# Patient Record
Sex: Female | Born: 1990 | Race: Black or African American | Hispanic: No
Health system: Southern US, Community
[De-identification: ages and names within clinical notes are randomized; demographics above are authoritative.]

## PROBLEM LIST (undated history)

## (undated) DIAGNOSIS — G43909 Migraine, unspecified, not intractable, without status migrainosus: Secondary | ICD-10-CM

## (undated) DIAGNOSIS — I10 Essential (primary) hypertension: Secondary | ICD-10-CM

## (undated) DIAGNOSIS — F419 Anxiety disorder, unspecified: Secondary | ICD-10-CM

---

## 2011-11-14 ENCOUNTER — Emergency Department (HOSPITAL_COMMUNITY): Payer: Managed Care, Other (non HMO)

## 2011-11-14 ENCOUNTER — Encounter (HOSPITAL_COMMUNITY): Payer: Self-pay | Admitting: *Deleted

## 2011-11-14 ENCOUNTER — Emergency Department (HOSPITAL_COMMUNITY)
Admission: EM | Admit: 2011-11-14 | Discharge: 2011-11-15 | Disposition: A | Discharge status: Home / Self Care | Payer: Managed Care, Other (non HMO) | Attending: Emergency Medicine | Admitting: Emergency Medicine

## 2011-11-14 DIAGNOSIS — R000 Tachycardia, unspecified: Secondary | ICD-10-CM | POA: Insufficient documentation

## 2011-11-14 DIAGNOSIS — J111 Influenza due to unidentified influenza virus with other respiratory manifestations: Secondary | ICD-10-CM

## 2011-11-14 LAB — COMPREHENSIVE METABOLIC PANEL
ALT: 10 U/L (ref 0–35)
AST: 14 U/L (ref 0–37)
Albumin: 4 g/dL (ref 3.5–5.2)
CO2: 24 mEq/L (ref 19–32)
Calcium: 9.5 mg/dL (ref 8.4–10.5)
Chloride: 101 mEq/L (ref 96–112)
Creatinine, Ser: 0.96 mg/dL (ref 0.50–1.10)
GFR calc non Af Amer: 85 mL/min — ABNORMAL LOW (ref >90)
Sodium: 135 mEq/L (ref 135–145)
Total Bilirubin: 0.5 mg/dL (ref 0.3–1.2)

## 2011-11-14 LAB — URINALYSIS, ROUTINE W REFLEX MICROSCOPIC
Leukocytes, UA: NEGATIVE
Nitrite: NEGATIVE
Protein, ur: NEGATIVE mg/dL
Specific Gravity, Urine: 1.025 (ref 1.005–1.030)
Urobilinogen, UA: 1 mg/dL (ref 0.0–1.0)

## 2011-11-14 LAB — CBC WITH DIFFERENTIAL/PLATELET
Basophils Absolute: 0 10*3/uL (ref 0.0–0.1)
Basophils Relative: 0 % (ref 0–1)
Lymphocytes Relative: 12 % (ref 12–46)
MCHC: 33.9 g/dL (ref 30.0–36.0)
Neutro Abs: 7.9 10*3/uL — ABNORMAL HIGH (ref 1.7–7.7)
Platelets: 302 10*3/uL (ref 150–400)
RDW: 13.2 % (ref 11.5–15.5)
WBC: 9.9 10*3/uL (ref 4.0–10.5)

## 2011-11-14 LAB — URINE MICROSCOPIC-ADD ON

## 2011-11-14 LAB — PREGNANCY, URINE: Preg Test, Ur: NEGATIVE

## 2011-11-14 MED ORDER — IBUPROFEN 400 MG PO TABS
400.0000 mg | ORAL_TABLET | Freq: Once | ORAL | Status: AC
  Administered 2011-11-14: 400 mg via ORAL

## 2011-11-14 MED ORDER — ACETAMINOPHEN 325 MG PO TABS
650.0000 mg | ORAL_TABLET | Freq: Once | ORAL | Status: AC
  Administered 2011-11-14: 650 mg via ORAL
  Filled 2011-11-14: qty 2

## 2011-11-14 NOTE — ED Provider Notes (Addendum)
History     CSN: [id number]  Arrival date & time 11/14/11  2027   First MD Initiated Contact with Patient 11/14/11 2322      Chief Complaint  Patient presents with  . multiple complaints     (Consider location/radiation/quality/duration/timing/severity/associated sxs/prior treatment) Patient is a 21 y.o. female presenting with URI. The history is provided by the patient.  URI The primary symptoms include fever, fatigue, sore throat, cough and myalgias. The current episode started yesterday. This is a new problem. The problem has been gradually worsening.  The fever began today. The maximum temperature recorded prior to her arrival was 103 to 104 F. The temperature was taken by an oral thermometer.  The sore throat began today. The sore throat is not accompanied by trouble swallowing, drooling, hoarse voice or stridor.  The onset of the illness is associated with exposure to sick contacts. Symptoms associated with the illness include chills, congestion and rhinorrhea.    History reviewed. No pertinent past medical history.  History reviewed. No pertinent past surgical history.  No family history on file.  History  Substance Use Topics  . Smoking status: Never Smoker   . Smokeless tobacco: Not on file  . Alcohol Use: Yes    OB History    Grav Para Term Preterm Abortions TAB SAB Ect Mult Living                  Review of Systems  Constitutional: Positive for fever, chills and fatigue.  HENT: Positive for congestion, sore throat and rhinorrhea. Negative for hoarse voice, drooling and trouble swallowing.   Respiratory: Positive for cough. Negative for stridor.   Musculoskeletal: Positive for myalgias.  All other systems reviewed and are negative.    Allergies  Review of patient's allergies indicates no known allergies.  Home Medications   Current Outpatient Rx  Name Route Sig Dispense Refill  . IBUPROFEN 200 MG PO TABS Oral Take 400 mg by mouth every 6 (six)  hours as needed. For pain    . NORGESTIMATE-ETH ESTRADIOL 0.25-35 MG-MCG PO TABS Oral Take 1 tablet by mouth daily.    Marland Kitchen PSEUDOEPH-DOXYLAMINE-DM-APAP 60-12.06-27-998 MG/30ML PO LIQD Oral Take 30 mLs by mouth daily as needed. For congestion      BP 128/81  Pulse 103  Temp 102.5 F (39.2 C) (Oral)  Resp 18  SpO2 100%  LMP 11/11/2011  Physical Exam  Nursing note and vitals reviewed. Constitutional: She is oriented to person, place, and time. She appears well-developed and well-nourished. No distress.  HENT:  Head: Normocephalic and atraumatic.  Right Ear: Tympanic membrane and ear canal normal.  Left Ear: Tympanic membrane and ear canal normal.  Nose: Mucosal edema and rhinorrhea present.  Mouth/Throat: Posterior oropharyngeal erythema present. No oropharyngeal exudate or posterior oropharyngeal edema.  Eyes: Conjunctivae normal and EOM are normal. Pupils are equal, round, and reactive to light.  Neck: Normal range of motion. Neck supple.  Cardiovascular: Regular rhythm and intact distal pulses.  Tachycardia present.   No murmur heard. Pulmonary/Chest: Effort normal and breath sounds normal. No respiratory distress. She has no wheezes. She has no rales.  Abdominal: Soft. She exhibits no distension. There is no tenderness. There is no rebound and no guarding.  Musculoskeletal: Normal range of motion. She exhibits no edema and no tenderness.  Neurological: She is alert and oriented to person, place, and time.  Skin: Skin is warm and dry. No rash noted. No erythema.  Psychiatric: She has a normal mood and  affect. Her behavior is normal.    ED Course  Procedures (including critical care time)  Labs Reviewed  URINALYSIS, ROUTINE W REFLEX MICROSCOPIC - Abnormal; Notable for the following:    Hgb urine dipstick MODERATE (*)     All other components within normal limits  CBC WITH DIFFERENTIAL - Abnormal; Notable for the following:    Neutrophils Relative 80 (*)     Neutro Abs 7.9 (*)      All other components within normal limits  COMPREHENSIVE METABOLIC PANEL - Abnormal; Notable for the following:    Glucose, Bld 100 (*)     GFR calc non Af Amer 85 (*)     All other components within normal limits  URINE MICROSCOPIC-ADD ON - Abnormal; Notable for the following:    Squamous Epithelial / LPF FEW (*)     All other components within normal limits  PREGNANCY, URINE  RAPID STREP SCREEN   Dg Chest 2 View  11/14/2011  *RADIOLOGY REPORT*  Clinical Data: Cough and fever.  CHEST - 2 VIEW  Comparison: None.  Findings: The heart size and pulmonary vascularity are normal. The lungs appear clear and expanded without focal air space disease or consolidation. No blunting of the costophrenic angles.  No pneumothorax.  Mediastinal contours appear intact.  Small cervical ribs.  IMPRESSION: No evidence of active pulmonary disease.   Original Report Authenticated By: Marlon Pel, M.D.      1. Influenza       MDM   Pt with symptoms consistent with influenza.  Normal exam here but is febrile.  No signs of breathing difficulty  No signs of strep pharyngitis, otitis or abnormal abdominal findings.   CXR wnl and labs wnl.  Will continue antipyretica and rest and fluids and return for any further problems.         Gwyneth Sprout, MD 11/14/11 1610  Gwyneth Sprout, MD 11/15/11 9604

## 2011-11-14 NOTE — ED Notes (Signed)
The pt has a cold  With aching all over her body sorethroat chills  Coughing non-productive since yesterday.  She thinks she has the flu

## 2011-11-14 NOTE — ED Notes (Signed)
Last tylenol this am

## 2011-11-14 NOTE — ED Notes (Signed)
Re-iterates sx of: cough, sneezing, fever, sore throat. Onset last night. Pt alert, NAD, calm, interactive, skin W&D, resps e/u, speaking in clear complete sentences. Lab results reviewed. cxr & rapid strep added per protocol. To xray now.

## 2011-11-15 MED ORDER — OSELTAMIVIR PHOSPHATE 75 MG PO CAPS: 75.0000 mg | ORAL_CAPSULE | Freq: Two times a day (BID) | ORAL | Status: DC

## 2012-12-11 ENCOUNTER — Emergency Department (HOSPITAL_COMMUNITY): Payer: Managed Care, Other (non HMO)

## 2012-12-11 ENCOUNTER — Encounter (HOSPITAL_COMMUNITY): Payer: Self-pay | Admitting: Emergency Medicine

## 2012-12-11 ENCOUNTER — Emergency Department (HOSPITAL_COMMUNITY)
Admission: EM | Admit: 2012-12-11 | Discharge: 2012-12-12 | Disposition: A | Discharge status: Home / Self Care | Payer: Managed Care, Other (non HMO) | Attending: Emergency Medicine | Admitting: Emergency Medicine

## 2012-12-11 DIAGNOSIS — R091 Pleurisy: Secondary | ICD-10-CM | POA: Insufficient documentation

## 2012-12-11 DIAGNOSIS — R079 Chest pain, unspecified: Secondary | ICD-10-CM

## 2012-12-11 DIAGNOSIS — R0602 Shortness of breath: Secondary | ICD-10-CM | POA: Insufficient documentation

## 2012-12-11 LAB — POCT I-STAT TROPONIN I: Troponin i, poc: 0 ng/mL (ref 0.00–0.08)

## 2012-12-11 LAB — POCT I-STAT, CHEM 8
BUN: 9 mg/dL (ref 6–23)
Chloride: 104 mEq/L (ref 96–112)
Creatinine, Ser: 1.2 mg/dL — ABNORMAL HIGH (ref 0.50–1.10)
Potassium: 3.6 mEq/L (ref 3.5–5.1)
Sodium: 141 mEq/L (ref 135–145)
TCO2: 25 mmol/L (ref 0–100)

## 2012-12-11 NOTE — ED Notes (Signed)
Pt presents with c/o chest pain that she says has been going on for about a week. Pt says the pain is in the center of her chest. Pt says she feels the pain when she takes a deep breath, denies cough. Pt says she has had some shortness of breath during the day with this chest pain but does not have any shortness of breath at this time.

## 2012-12-12 LAB — CBC WITH DIFFERENTIAL/PLATELET
Hemoglobin: 12.3 g/dL (ref 12.0–15.0)
Lymphocytes Relative: 44 % (ref 12–46)
Lymphs Abs: 3.3 10*3/uL (ref 0.7–4.0)
MCH: 25.7 pg — ABNORMAL LOW (ref 26.0–34.0)
Monocytes Relative: 8 % (ref 3–12)
Neutro Abs: 3.4 10*3/uL (ref 1.7–7.7)
Neutrophils Relative %: 46 % (ref 43–77)
Platelets: 326 10*3/uL (ref 150–400)
RBC: 4.78 MIL/uL (ref 3.87–5.11)
WBC: 7.4 10*3/uL (ref 4.0–10.5)

## 2012-12-12 LAB — D-DIMER, QUANTITATIVE: D-Dimer, Quant: <0.27 ug/mL-FEU (ref 0.00–0.48)

## 2012-12-12 MED ORDER — KETOROLAC TROMETHAMINE 60 MG/2ML IM SOLN
60.0000 mg | Freq: Once | INTRAMUSCULAR | Status: AC
  Administered 2012-12-12: 60 mg via INTRAMUSCULAR
  Filled 2012-12-12: qty 2

## 2012-12-12 MED ORDER — KETOROLAC TROMETHAMINE 30 MG/ML IJ SOLN
30.0000 mg | Freq: Once | INTRAMUSCULAR | Status: DC
  Filled 2012-12-12: qty 1

## 2012-12-12 MED ORDER — NAPROXEN 500 MG PO TABS: 500.0000 mg | ORAL_TABLET | Freq: Two times a day (BID) | ORAL | Status: DC

## 2012-12-12 NOTE — ED Provider Notes (Signed)
Medical screening examination/treatment/procedure(s) were performed by non-physician practitioner and as supervising physician I was immediately available for consultation/collaboration.  Sunnie Nielsen, MD 12/12/12 [phone]

## 2012-12-12 NOTE — ED Provider Notes (Signed)
CSN: [id number]     Arrival date & time 12/11/12  2258 History   First MD Initiated Contact with Patient 12/11/12 2308     Chief Complaint  Patient presents with  . Chest Pain   (Consider location/radiation/quality/duration/timing/severity/associated sxs/prior Treatment) HPI Whitney Andrews is a 22 y.o. female presents emergency department complaining of pleuritic chest pain or shortness of breath. Patient states that her pain started a week ago. States it is worsened with taking deep breaths and coughing. She denies any cough. States having shortness of breath and states pain feels like tightness and sharp at the same time. States pain is in the center of the chest and radiates to left side. States pain has been constant for a week. 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 She denies any injuries. She denies taking any oral contraceptives and denies any recent travel or surgeries. Patient denies any urinary symptoms and denies any fever. She denies any prior similar symptoms. She is otherwise healthy and does not take any medications.  History reviewed. No pertinent past medical history. History reviewed. No pertinent past surgical history. No family history on file. History  Substance Use Topics  . Smoking status: Never Smoker   . Smokeless tobacco: Never Used  . Alcohol Use: Yes     Comment: socially    OB History   Grav Para Term Preterm Abortions TAB SAB Ect Mult Living                 Review of Systems  Constitutional: Negative for fever and chills.  Respiratory: Positive for chest tightness and shortness of breath. Negative for cough.   Cardiovascular: Positive for chest pain. Negative for palpitations and leg swelling.  Gastrointestinal: Negative for nausea, vomiting, abdominal pain and diarrhea.  Genitourinary: Negative for dysuria, flank pain, vaginal bleeding, vaginal discharge, vaginal pain and pelvic pain.  Musculoskeletal: Negative for arthralgias, myalgias, neck  pain and neck stiffness.  Skin: Negative for rash.  Neurological: Negative for dizziness, weakness and headaches.  All other systems reviewed and are negative.    Allergies  Review of patient's allergies indicates no known allergies.  Home Medications  No current outpatient prescriptions on file. BP 139/89  Pulse 76  Temp(Src) 98.2 F (36.8 C) (Oral)  Resp 14  Ht 5\' 7"  (1.702 m)  SpO2 100%  LMP 11/20/2012 Physical Exam  Nursing note and vitals reviewed. Constitutional: She appears well-developed and well-nourished. No distress.  HENT:  Head: Normocephalic.  Eyes: Conjunctivae are normal.  Neck: Neck supple.  Cardiovascular: Normal rate, regular rhythm and normal heart sounds.   Pulmonary/Chest: Effort normal and breath sounds normal. No respiratory distress. She has no wheezes. She has no rales. She exhibits no tenderness.  No chest tenderness  Abdominal: Soft. Bowel sounds are normal. She exhibits no distension. There is no tenderness. There is no rebound.  Musculoskeletal: She exhibits no edema.  No lower extremity edema bilaterally, no calf swelling or tenderness  Neurological: She is alert.  Skin: Skin is warm and dry.  Psychiatric: She has a normal mood and affect. Her behavior is normal.    ED Course  Procedures (including critical care time) Labs Review Labs Reviewed  CBC WITH DIFFERENTIAL - Abnormal; Notable for the following:    MCV 77.8 (*)    MCH 25.7 (*)    All other components within normal limits  POCT I-STAT, CHEM 8 - Abnormal; Notable for the following:    Creatinine, Ser 1.20 (*)  Calcium, Ion 1.28 (*)    All other components within normal limits  D-DIMER, QUANTITATIVE  POCT I-STAT TROPONIN I   Imaging Review Dg Chest 2 View  12/11/2012   CLINICAL DATA:  New onset of mid chest pain and shortness of breath.  EXAM: CHEST  2 VIEW  COMPARISON:  Chest radiograph performed 11/14/2011  FINDINGS: The lungs are well-aerated and clear. There is no  evidence of focal opacification, pleural effusion or pneumothorax.  The heart is normal in size; the mediastinal contour is within normal limits. No acute osseous abnormalities are seen.  IMPRESSION: No acute cardiopulmonary process seen.   Electronically Signed   By: Roanna Raider M.D.   On: 12/11/2012 23:50    EKG Interpretation   None       MDM   1. Pleurisy   2. Chest pain     Patient with pleuritic chest pain for a week. Patient's vital signs are normal. Labwork including d-dimer are negative. Chest x-ray is negative. Troponin EKG negative. She is low risk for coronary artery disease. Given normal vital signs negative d-dimer I do not think she has PE. She is no pneumothorax or infection on her chest x-ray. Suspect most likely musculoskeletal chest wall pain or pleurisy. 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 She was given Toradol emergency department. Will discharge her home with outpatient followup \ Filed Vitals:   12/11/12 2310  BP: 139/89  Pulse: 76  Temp: 98.2 F (36.8 C)  TempSrc: Oral  Resp: 14  Height: 5\' 7"  (1.702 m)  SpO2: 100%      Lottie Mussel, PA-C 12/12/12 0049

## 2014-03-21 ENCOUNTER — Encounter (HOSPITAL_COMMUNITY): Payer: Self-pay | Admitting: *Deleted

## 2014-03-21 ENCOUNTER — Emergency Department (INDEPENDENT_AMBULATORY_CARE_PROVIDER_SITE_OTHER)
Admission: EM | Admit: 2014-03-21 | Discharge: 2014-03-21 | Disposition: A | Discharge status: Home / Self Care | Payer: BLUE CROSS/BLUE SHIELD | Source: Home / Self Care | Attending: Family Medicine | Admitting: Family Medicine

## 2014-03-21 DIAGNOSIS — G43019 Migraine without aura, intractable, without status migrainosus: Secondary | ICD-10-CM

## 2014-03-21 HISTORY — DX: Migraine, unspecified, not intractable, without status migrainosus: G43.909

## 2014-03-21 MED ORDER — DEXAMETHASONE SODIUM PHOSPHATE 10 MG/ML IJ SOLN
10.0000 mg | Freq: Once | INTRAMUSCULAR | Status: AC
  Administered 2014-03-21: 10 mg via INTRAMUSCULAR

## 2014-03-21 MED ORDER — METOCLOPRAMIDE HCL 5 MG/ML IJ SOLN
5.0000 mg | Freq: Once | INTRAMUSCULAR | Status: AC
  Administered 2014-03-21: 5 mg via INTRAMUSCULAR

## 2014-03-21 MED ORDER — DEXAMETHASONE SODIUM PHOSPHATE 10 MG/ML IJ SOLN
INTRAMUSCULAR | Status: AC
  Filled 2014-03-21: qty 1

## 2014-03-21 MED ORDER — KETOROLAC TROMETHAMINE 60 MG/2ML IM SOLN
60.0000 mg | Freq: Once | INTRAMUSCULAR | Status: AC
  Administered 2014-03-21: 60 mg via INTRAMUSCULAR

## 2014-03-21 MED ORDER — METOCLOPRAMIDE HCL 5 MG/ML IJ SOLN
INTRAMUSCULAR | Status: AC
  Filled 2014-03-21: qty 2

## 2014-03-21 MED ORDER — KETOROLAC TROMETHAMINE 60 MG/2ML IM SOLN
INTRAMUSCULAR | Status: AC
  Filled 2014-03-21: qty 2

## 2014-03-21 NOTE — ED Provider Notes (Signed)
Whitney CapesLeann Dudash is a 24 y.o. female who presents to Urgent Care today for headache. Patient has a 4 day history of severe headache. The headache is consistent with prior episodes of migraine but going on a bit longer than usual. She denies any visual aura, photophobia, or photophobia. She's tried Tylenol which helps. No fevers or chills vomiting or diarrhea. She notes that her mother died last week unexpectedly and she is under a great deal of stress.   Past Medical History  Diagnosis Date  . Migraines    History reviewed. No pertinent past surgical history. History  Substance Use Topics  . Smoking status: Never Smoker   . Smokeless tobacco: Never Used  . Alcohol Use: No   ROS as above Medications: No current facility-administered medications for this encounter.   Current Outpatient Prescriptions  Medication Sig Dispense Refill  . Norgestim-Eth Estrad Triphasic (ORTHO TRI-CYCLEN, 28, PO) Take by mouth.    . naproxen (NAPROSYN) 500 MG tablet Take 1 tablet (500 mg total) by mouth 2 (two) times daily. 30 tablet 0   No Known Allergies   Exam:  BP 151/74 mmHg  Pulse 66  Temp(Src) 98.2 F (36.8 C) (Oral)  Resp 16  SpO2 98%  LMP 03/14/2014 (Exact Date) Gen: Well NAD HEENT: EOMI,  MMM PERRL. Lungs: Normal work of breathing. CTABL Heart: RRR no MRG Abd: NABS, Soft. Nondistended, Nontender Exts: Brisk capillary refill, warm and well perfused.  Neuro: Alert and oriented cranial nerves II through XII are intact Coordination strength sensation and reflexes. Normal balance and gait   Patient was given the below medications prior to discharge: . dexamethasone (DECADRON) injection 10 mg  10 mg Intramuscular Once Rodolph BongEvan S Corey, MD      . ketorolac (TORADOL) injection 60 mg  60 mg Intramuscular Once Rodolph BongEvan S Corey, MD      . metoCLOPramide (REGLAN) injection 5 mg  5 mg Intramuscular Once Rodolph BongEvan S Corey, MD         No results found for this or any previous visit (from the past 24 hour(s)). No  results found.  Assessment and Plan: 24 y.o. female with migraine headache. Patient felt better with injections as above. Follow-up with PCP for further evaluation of headache and elevated blood pressure. Return as needed.  Discussed warning signs or symptoms. Please see discharge instructions. Patient expresses understanding.     Rodolph BongEvan S Corey, MD 03/21/14 1024

## 2014-03-21 NOTE — Discharge Instructions (Signed)
Thank you for coming in today.   Migraine Headache A migraine headache is an intense, throbbing pain on one or both sides of your head. A migraine can last for 30 minutes to several hours. CAUSES  The exact cause of a migraine headache is not always known. However, a migraine may be caused when nerves in the brain become irritated and release chemicals that cause inflammation. This causes pain. Certain things may also trigger migraines, such as:  Alcohol.  Smoking.  Stress.  Menstruation.  Aged cheeses.  Foods or drinks that contain nitrates, glutamate, aspartame, or tyramine.  Lack of sleep.  Chocolate.  Caffeine.  Hunger.  Physical exertion.  Fatigue.  Medicines used to treat chest pain (nitroglycerine), birth control pills, estrogen, and some blood pressure medicines. SIGNS AND SYMPTOMS  Pain on one or both sides of your head.  Pulsating or throbbing pain.  Severe pain that prevents daily activities.  Pain that is aggravated by any physical activity.  Nausea, vomiting, or both.  Dizziness.  Pain with exposure to bright lights, loud noises, or activity.  General sensitivity to bright lights, loud noises, or smells. Before you get a migraine, you may get warning signs that a migraine is coming (aura). An aura may include:  Seeing flashing lights.  Seeing bright spots, halos, or zigzag lines.  Having tunnel vision or blurred vision.  Having feelings of numbness or tingling.  Having trouble talking.  Having muscle weakness. DIAGNOSIS  A migraine headache is often diagnosed based on:  Symptoms.  Physical exam.  A CT scan or MRI of your head. These imaging tests cannot diagnose migraines, but they can help rule out other causes of headaches. TREATMENT Medicines may be given for pain and nausea. Medicines can also be given to help prevent recurrent migraines.  HOME CARE INSTRUCTIONS  Only take over-the-counter or prescription medicines for pain  or discomfort as directed by your health care provider. The use of long-term narcotics is not recommended.  Lie down in a dark, quiet room when you have a migraine.  Keep a journal to find out what may trigger your migraine headaches. For example, write down:  What you eat and drink.  How much sleep you get.  Any change to your diet or medicines.  Limit alcohol consumption.  Quit smoking if you smoke.  Get 7-9 hours of sleep, or as recommended by your health care provider.  Limit stress.  Keep lights dim if bright lights bother you and make your migraines worse. SEEK IMMEDIATE MEDICAL CARE IF:   Your migraine becomes severe.  You have a fever.  You have a stiff neck.  You have vision loss.  You have muscular weakness or loss of muscle control.  You start losing your balance or have trouble walking.  You feel faint or pass out.  You have severe symptoms that are different from your first symptoms. MAKE SURE YOU:   Understand these instructions.  Will watch your condition.  Will get help right away if you are not doing well or get worse. Document Released: 01/15/2005 Document Revised: 06/01/2013 Document Reviewed: 09/22/2012 [facility] Patient Information 2015 [place], [place]. This information is not intended to replace advice given to you by your health care provider. Make sure you discuss any questions you have with your health care provider.

## 2014-03-21 NOTE — ED Notes (Signed)
C/O typical-feeling migraine, but for extended time - x4 days.  Took 2 doses of 1g acetaminophen yesterday without relief.  Denies any photosensitivity or n/v.

## 2014-04-20 ENCOUNTER — Encounter (HOSPITAL_COMMUNITY): Payer: Self-pay | Admitting: Emergency Medicine

## 2014-04-20 ENCOUNTER — Emergency Department (HOSPITAL_COMMUNITY)
Admission: EM | Admit: 2014-04-20 | Discharge: 2014-04-20 | Disposition: A | Discharge status: Home / Self Care | Payer: BLUE CROSS/BLUE SHIELD | Source: Home / Self Care | Attending: Family Medicine | Admitting: Family Medicine

## 2014-04-20 DIAGNOSIS — G43011 Migraine without aura, intractable, with status migrainosus: Secondary | ICD-10-CM

## 2014-04-20 MED ORDER — DEXAMETHASONE SODIUM PHOSPHATE 10 MG/ML IJ SOLN
INTRAMUSCULAR | Status: AC
  Filled 2014-04-20: qty 1

## 2014-04-20 MED ORDER — METOCLOPRAMIDE HCL 5 MG/ML IJ SOLN
INTRAMUSCULAR | Status: AC
  Filled 2014-04-20: qty 2

## 2014-04-20 MED ORDER — SUMATRIPTAN SUCCINATE 50 MG PO TABS: 50.0000 mg | ORAL_TABLET | ORAL | Status: AC | PRN

## 2014-04-20 MED ORDER — METOCLOPRAMIDE HCL 5 MG/ML IJ SOLN
5.0000 mg | Freq: Once | INTRAMUSCULAR | Status: AC
  Administered 2014-04-20: 5 mg via INTRAMUSCULAR

## 2014-04-20 MED ORDER — KETOROLAC TROMETHAMINE 60 MG/2ML IM SOLN
INTRAMUSCULAR | Status: AC
  Filled 2014-04-20: qty 2

## 2014-04-20 MED ORDER — DEXAMETHASONE SODIUM PHOSPHATE 10 MG/ML IJ SOLN
10.0000 mg | Freq: Once | INTRAMUSCULAR | Status: AC
  Administered 2014-04-20: 10 mg via INTRAMUSCULAR

## 2014-04-20 MED ORDER — KETOROLAC TROMETHAMINE 60 MG/2ML IM SOLN
60.0000 mg | Freq: Once | INTRAMUSCULAR | Status: AC
  Administered 2014-04-20: 60 mg via INTRAMUSCULAR

## 2014-04-20 NOTE — Discharge Instructions (Signed)
Thank you for coming in today. Follow up with primary doctor.  Go to the emergency room if your headache becomes excruciating or you have weakness or numbness or uncontrolled vomiting.  Call or go to the emergency room if you get worse, have trouble breathing, have chest pains, or palpitations.    Migraine Headache A migraine headache is an intense, throbbing pain on one or both sides of your head. A migraine can last for 30 minutes to several hours. CAUSES  The exact cause of a migraine headache is not always known. However, a migraine may be caused when nerves in the brain become irritated and release chemicals that cause inflammation. This causes pain. Certain things may also trigger migraines, such as:  Alcohol.  Smoking.  Stress.  Menstruation.  Aged cheeses.  Foods or drinks that contain nitrates, glutamate, aspartame, or tyramine.  Lack of sleep.  Chocolate.  Caffeine.  Hunger.  Physical exertion.  Fatigue.  Medicines used to treat chest pain (nitroglycerine), birth control pills, estrogen, and some blood pressure medicines. SIGNS AND SYMPTOMS  Pain on one or both sides of your head.  Pulsating or throbbing pain.  Severe pain that prevents daily activities.  Pain that is aggravated by any physical activity.  Nausea, vomiting, or both.  Dizziness.  Pain with exposure to bright lights, loud noises, or activity.  General sensitivity to bright lights, loud noises, or smells. Before you get a migraine, you may get warning signs that a migraine is coming (aura). An aura may include:  Seeing flashing lights.  Seeing bright spots, halos, or zigzag lines.  Having tunnel vision or blurred vision.  Having feelings of numbness or tingling.  Having trouble talking.  Having muscle weakness. DIAGNOSIS  A migraine headache is often diagnosed based on:  Symptoms.  Physical exam.  A CT scan or MRI of your head. These imaging tests cannot diagnose  migraines, but they can help rule out other causes of headaches. TREATMENT Medicines may be given for pain and nausea. Medicines can also be given to help prevent recurrent migraines.  HOME CARE INSTRUCTIONS  Only take over-the-counter or prescription medicines for pain or discomfort as directed by your health care provider. The use of long-term narcotics is not recommended.  Lie down in a dark, quiet room when you have a migraine.  Keep a journal to find out what may trigger your migraine headaches. For example, write down:  What you eat and drink.  How much sleep you get.  Any change to your diet or medicines.  Limit alcohol consumption.  Quit smoking if you smoke.  Get 7-9 hours of sleep, or as recommended by your health care provider.  Limit stress.  Keep lights dim if bright lights bother you and make your migraines worse. SEEK IMMEDIATE MEDICAL CARE IF:   Your migraine becomes severe.  You have a fever.  You have a stiff neck.  You have vision loss.  You have muscular weakness or loss of muscle control.  You start losing your balance or have trouble walking.  You feel faint or pass out.  You have severe symptoms that are different from your first symptoms. MAKE SURE YOU:   Understand these instructions.  Will watch your condition.  Will get help right away if you are not doing well or get worse. Document Released: 01/15/2005 Document Revised: 06/01/2013 Document Reviewed: 09/22/2012 [facility] Patient Information 2015 [place], [place]. This information is not intended to replace advice given to you by your health care  provider. Make sure you discuss any questions you have with your health care provider.  PRIMARY CARE Merchant navy officerDOCTORS [facility] [address]  [place], [place] [phone]  Fax [phone]  Nature conservation officerLeBauer [facility] at [facility] [address]  [place], [place] Ph  [phone]  Fax [phone]  Nature conservation officerLeBauer [facility]Care at [place] / [place] [phone]  [place], [place] Ph [phone]  Fax [phone]  [facility] [address], [address]  [place], [place] [phone]  Fax [phone]  [facility] [address]  [place], [place] [phone]  Fax [phone]  [facility] at [facility] [address] [place]  [place], [place] Ph [phone]  Fax [phone]   [facility] @ [place] [address] [place] [address] Phone: [phone]   [facility] @ [facility] [address] [place] [address] Phone: [phone]   [facility] @ [place] [address] [place] [address] Phone: [phone]   [facility] @ [place] [address] [place] [address] Phone: [phone]   [facility] @ [place] [address] [place] [address] Phone: [phone] Fax: [phone]   [facility] @ [place] [address] [place] [address] Phone: [phone]   Dr. Maryelizabeth RowanElizabeth Dewey [address] [place] [address] [phone]

## 2014-04-20 NOTE — ED Provider Notes (Signed)
Whitney CapesLeann Borenstein is a 24 y.o. female who presents to Urgent Care today for migraine headache. Patient is a 2 day history of migraine headache consistent with prior migraines. She notes photophobia but denies any weakness numbness vision changes or loss of function difficulty swallowing or speaking. She has tried Tylenol which has not helped much. She's been having more migraines recently due to the death of her mother. She denies any personal or family history of hypertension. No chest pains palpitations or shortness of breath.   Past Medical History  Diagnosis Date  . Migraines    History reviewed. No pertinent past surgical history. History  Substance Use Topics  . Smoking status: Never Smoker   . Smokeless tobacco: Never Used  . Alcohol Use: No   ROS as above Medications: Current Facility-Administered Medications  Medication Dose Route Frequency Provider Last Rate Last Dose  . dexamethasone (DECADRON) injection 10 mg  10 mg Intramuscular Once Rodolph BongEvan S Correna Meacham, MD      . ketorolac (TORADOL) injection 60 mg  60 mg Intramuscular Once Rodolph BongEvan S Tiki Tucciarone, MD      . metoCLOPramide (REGLAN) injection 5 mg  5 mg Intramuscular Once Rodolph BongEvan S Winnie Barsky, MD       Current Outpatient Prescriptions  Medication Sig Dispense Refill  . Norgestim-Eth Estrad Triphasic (ORTHO TRI-CYCLEN, 28, PO) Take by mouth.     No Known Allergies   Exam:  BP 159/87 mmHg  Pulse 65  Temp(Src) 98.3 F (36.8 C) (Oral)  Resp 18  SpO2 98%  LMP 04/12/2014 Gen: Well NAD HEENT: EOMI,  MMM PERRLA Lungs: Normal work of breathing. CTABL Heart: RRR no MRG Abd: NABS, Soft. Nondistended, Nontender Exts: Brisk capillary refill, warm and well perfused.  Neuro: Alert and oriented cranial nerves II through XII are intact normal balance sensation gait. Normal coordination and reflexes.  Patient was given 10 mg IM dexamethasone, 60 mg IM Toradol, and 5 mg IM Reglan  No results found for this or any previous visit (from the past 24 hour(s)). No  results found.  Assessment and Plan: 24 y.o. female with migraine headache. Treatment with medications as above. Prescribed Imitrex. Elevated blood pressure. Follow-up with PCP.  Discussed warning signs or symptoms. Please see discharge instructions. Patient expresses understanding.     Rodolph BongEvan S Rey Dansby, MD 04/20/14 [phone]

## 2014-04-20 NOTE — ED Notes (Signed)
Reports history of migraine.  Patient reports last was within a few months and occurred when mother died.  Prior to this, the last migraine was in high school ( 4 years ago).

## 2014-07-12 ENCOUNTER — Ambulatory Visit: Payer: BLUE CROSS/BLUE SHIELD | Admitting: Family

## 2014-07-12 DIAGNOSIS — Z0289 Encounter for other administrative examinations: Secondary | ICD-10-CM

## 2014-09-19 ENCOUNTER — Ambulatory Visit (INDEPENDENT_AMBULATORY_CARE_PROVIDER_SITE_OTHER): Payer: BLUE CROSS/BLUE SHIELD | Admitting: Internal Medicine

## 2014-09-19 VITALS — BP 130/80 | HR 60 | Temp 98.4°F | Resp 14 | Ht 69.0 in | Wt 227.0 lb

## 2014-09-19 DIAGNOSIS — H6502 Acute serous otitis media, left ear: Secondary | ICD-10-CM | POA: Diagnosis not present

## 2014-09-19 DIAGNOSIS — E669 Obesity, unspecified: Secondary | ICD-10-CM | POA: Insufficient documentation

## 2014-09-19 NOTE — Patient Instructions (Signed)
Allegra -d 12hour form One twice a day for 5-7days

## 2014-09-19 NOTE — Progress Notes (Signed)
   Subjective:  This chart was scribed for Whitney Sia, MD by [facility], medical scribe at [facility].The patient was seen in exam room 01 and the patient's care was started at 10:41 AM.   Patient ID: Whitney Andrews, female    DOB: August 08, 1990, 24 y.o.   MRN: [id number] Chief Complaint  Patient presents with  . Ear Fullness    LEFT ear   HPI  HPI Comments: Whitney Andrews is a 24 y.o. female who presents to [facility] complaining of a bilateral hearing loss, left worse than right since yesterday. Hearing is muffled. She has not flown recently, and does not swim. No ear pain, fever, chills, sore throat and cough.  Past Medical History  Diagnosis Date  . Migraines    Prior to Admission medications   Medication Sig Start Date End Date Taking? Authorizing Provider  Norgestim-Eth Estrad Triphasic (ORTHO TRI-CYCLEN, 28, PO) Take by mouth.   Yes Historical Provider, MD  SUMAtriptan (IMITREX) 50 MG tablet Take 1 tablet (50 mg total) by mouth every 2 (two) hours as needed for migraine. May repeat in 2 hours if headache persists or recurs. 04/20/14  Yes Whitney Bong, MD   No Known Allergies  Review of Systems  Constitutional: Negative for fever and chills.  HENT: Positive for hearing loss. Negative for ear pain.   Respiratory: Negative for cough.       Objective:  BP 130/80 mmHg  Pulse 60  Temp(Src) 98.4 F (36.9 C) (Oral)  Resp 14  Ht  (1.753 m)  Wt 227 lb (102.967 kg)  BMI 33.51 kg/m2  SpO2 98% Physical Exam  Constitutional: She appears well-developed and well-nourished. No distress.  HENT:  Head: Normocephalic and atraumatic.  Right Ear: External ear normal.  Left Ear: External ear normal.  Nose: Nose normal.  Mouth/Throat: Oropharynx is clear and moist.  Altho tms appear wnl,canals clear, she has weber to L with perception difference L versus R Rinne wnl Hearing is not absent on R perceiving finger rub easily  Eyes: Conjunctivae  and EOM are normal. Pupils are equal, round, and reactive to light.  Neck: Normal range of motion. No thyromegaly present.  Pulmonary/Chest: Effort normal. No respiratory distress.  Lymphadenopathy:    She has no cervical adenopathy.  Nursing note and vitals reviewed.     Assessment & Plan:  Acute serous otitis media ? Due to allergic etiology  Allegra-d bid 5-7d If any hearing loss f/u at once for prednisone and ENT ref  rec overall health approach to include wt loss .  I have completed the patient encounter in its entirety as documented by the scribe, with editing by me where necessary. Whitney Andrews, M.D.

## 2014-10-11 ENCOUNTER — Other Ambulatory Visit (HOSPITAL_COMMUNITY)
Admission: RE | Admit: 2014-10-11 | Discharge: 2014-10-11 | Disposition: A | Discharge status: Home / Self Care | Payer: BLUE CROSS/BLUE SHIELD | Source: Ambulatory Visit | Attending: Family Medicine | Admitting: Family Medicine

## 2014-10-11 ENCOUNTER — Other Ambulatory Visit: Payer: Self-pay | Admitting: Physician Assistant

## 2014-10-11 DIAGNOSIS — Z124 Encounter for screening for malignant neoplasm of cervix: Secondary | ICD-10-CM | POA: Diagnosis not present

## 2014-10-13 LAB — CYTOLOGY - PAP

## 2014-12-30 ENCOUNTER — Emergency Department (HOSPITAL_COMMUNITY)
Admission: EM | Admit: 2014-12-30 | Discharge: 2014-12-30 | Disposition: A | Discharge status: Home / Self Care | Payer: BLUE CROSS/BLUE SHIELD | Attending: Physician Assistant | Admitting: Physician Assistant

## 2014-12-30 ENCOUNTER — Encounter (HOSPITAL_COMMUNITY): Payer: Self-pay

## 2014-12-30 DIAGNOSIS — Z711 Person with feared health complaint in whom no diagnosis is made: Secondary | ICD-10-CM | POA: Insufficient documentation

## 2014-12-30 DIAGNOSIS — Z79818 Long term (current) use of other agents affecting estrogen receptors and estrogen levels: Secondary | ICD-10-CM | POA: Insufficient documentation

## 2014-12-30 DIAGNOSIS — Z8659 Personal history of other mental and behavioral disorders: Secondary | ICD-10-CM | POA: Insufficient documentation

## 2014-12-30 DIAGNOSIS — G43909 Migraine, unspecified, not intractable, without status migrainosus: Secondary | ICD-10-CM | POA: Insufficient documentation

## 2014-12-30 HISTORY — DX: Anxiety disorder, unspecified: F41.9

## 2014-12-30 HISTORY — DX: Essential (primary) hypertension: I10

## 2014-12-30 NOTE — ED Provider Notes (Signed)
History  By signing my name below, I, Karle Plumber, attest that this documentation has been prepared under the direction and in the presence of Fayrene Helper, PA-C. Electronically Signed: Karle Plumber, ED Scribe. 12/30/2014. 3:52 PM.  Chief Complaint  Patient presents with  . Hypertension    DX SINCE FEB AFTER A DEATH   The history is provided by the patient and medical records. No language interpreter was used.    HPI Comments:  Whitney Andrews is a 24 y.o. obese female who presents to the Emergency Department complaining of hypertension since yesterday. She reports she was having CP yesterday. She also reports shallow breathing and one episode of heart fluttering. She reports mild chest discomfort today that began since being here. She states her BP has been elevated for the past nine months since her mom passed and was told by her PCP when she was there for her annual exam. She reports an increase in stress lately due to final exams coming up. She has not done anything to treat her symptoms. She denies modifying factors. She denies fever, chills, SOB, DOE, nausea, vomiting, rash or abdominal pain.    Past Medical History  Diagnosis Date  . Migraines   . Hypertension   . Anxiety    History reviewed. No pertinent past surgical history. History reviewed. No pertinent family history. Social History  Substance Use Topics  . Smoking status: Never Smoker   . Smokeless tobacco: Never Used  . Alcohol Use: No   OB History    No data available     Review of Systems  Constitutional: Negative for fever and chills.  Cardiovascular:       Hypertension  Gastrointestinal: Negative for nausea, vomiting and abdominal pain.  Skin: Negative for rash.    Allergies  Review of patient's allergies indicates no known allergies.  Home Medications   Prior to Admission medications   Medication Sig Start Date End Date Taking? Authorizing Provider  Norgestim-Eth Estrad Triphasic (ORTHO  TRI-CYCLEN, 28, PO) Take by mouth.   Yes Historical Provider, MD  TRI-LO-ESTARYLLA 0.18/0.215/0.25 MG-25 MCG tab  09/17/14  Yes Historical Provider, MD  SUMAtriptan (IMITREX) 50 MG tablet Take 1 tablet (50 mg total) by mouth every 2 (two) hours as needed for migraine. May repeat in 2 hours if headache persists or recurs. 04/20/14   Rodolph Bong, MD   Triage Vitals: BP 143/94 mmHg  Pulse 60  Temp(Src) 98.6 F (37 C) (Oral)  Resp 18  Ht  (1.702 m)  Wt 215 lb (97.523 kg)  BMI 33.67 kg/m2  SpO2 97%  LMP 12/09/2014 Physical Exam  Constitutional: She is oriented to person, place, and time. She appears well-developed and well-nourished.  HENT:  Head: Normocephalic and atraumatic.  Eyes: EOM are normal.  Neck: Normal range of motion.  Cardiovascular: Normal rate.   Pulmonary/Chest: Effort normal.  Musculoskeletal: Normal range of motion.  Neurological: She is alert and oriented to person, place, and time.  Skin: Skin is warm and dry.  Psychiatric: She has a normal mood and affect. Her behavior is normal.  Nursing note and vitals reviewed.   ED Course  Procedures (including critical care time) DIAGNOSTIC STUDIES: Oxygen Saturation is 97% on RA, normal by my interpretation.   COORDINATION OF CARE: 3:51 PM- Explained to pt that she would need to follow up with her PCP. Advised pt that diet, exercise and relieving stressors could help with BP. Explained that imaging or EKG was not indicated at this time.  Pt verbalizes understanding and agrees to plan.  Medications - No data to display   MDM   Final diagnoses:  Physically well but worried    BP 143/94 mmHg  Pulse 60  Temp(Src) 98.6 F (37 C) (Oral)  Resp 18  Ht 5\' 7"  (1.702 m)  Wt 97.523 kg  BMI 33.67 kg/m2  SpO2 97%  LMP 12/09/2014   I personally performed the services described in this documentation, which was scribed in my presence. The recorded information has been reviewed and is accurate.       Fayrene HelperBowie Kyana Aicher,  PA-C 12/30/14 1554  Courteney Lyn Corlis LeakMackuen, MD 01/01/15 0710

## 2014-12-30 NOTE — ED Notes (Addendum)
PT C/O HBP 126/90 YESTERDAY WITH CHEST PAIN. AT PRESENT NO CHEST PAIN, BUT FEELS THAT HER BREATHING IS "SHALLOW". PT STATES HER BP HAS BEEN ON THE HIGH SIDE SINCE FEB WHEN HER MOM PASSED AWAY.

## 2014-12-30 NOTE — Discharge Instructions (Signed)
Please follow up with your doctor for regular visits and to have your blood pressure recheck.  Focus on healthy diet and regular exercise along with healthy stress reducing activities to decrease risk of developing high blood pressure.  Return to ER if you have any concerns.

## 2015-04-28 IMAGING — CR DG CHEST 2V
2 series · 2 of 2 positions shown · non-contrast
Comparison: Chest radiograph performed 11/14/2011

CLINICAL DATA: New onset of mid chest pain and shortness of breath.

EXAM:
CHEST  2 VIEW

[w chest pa]
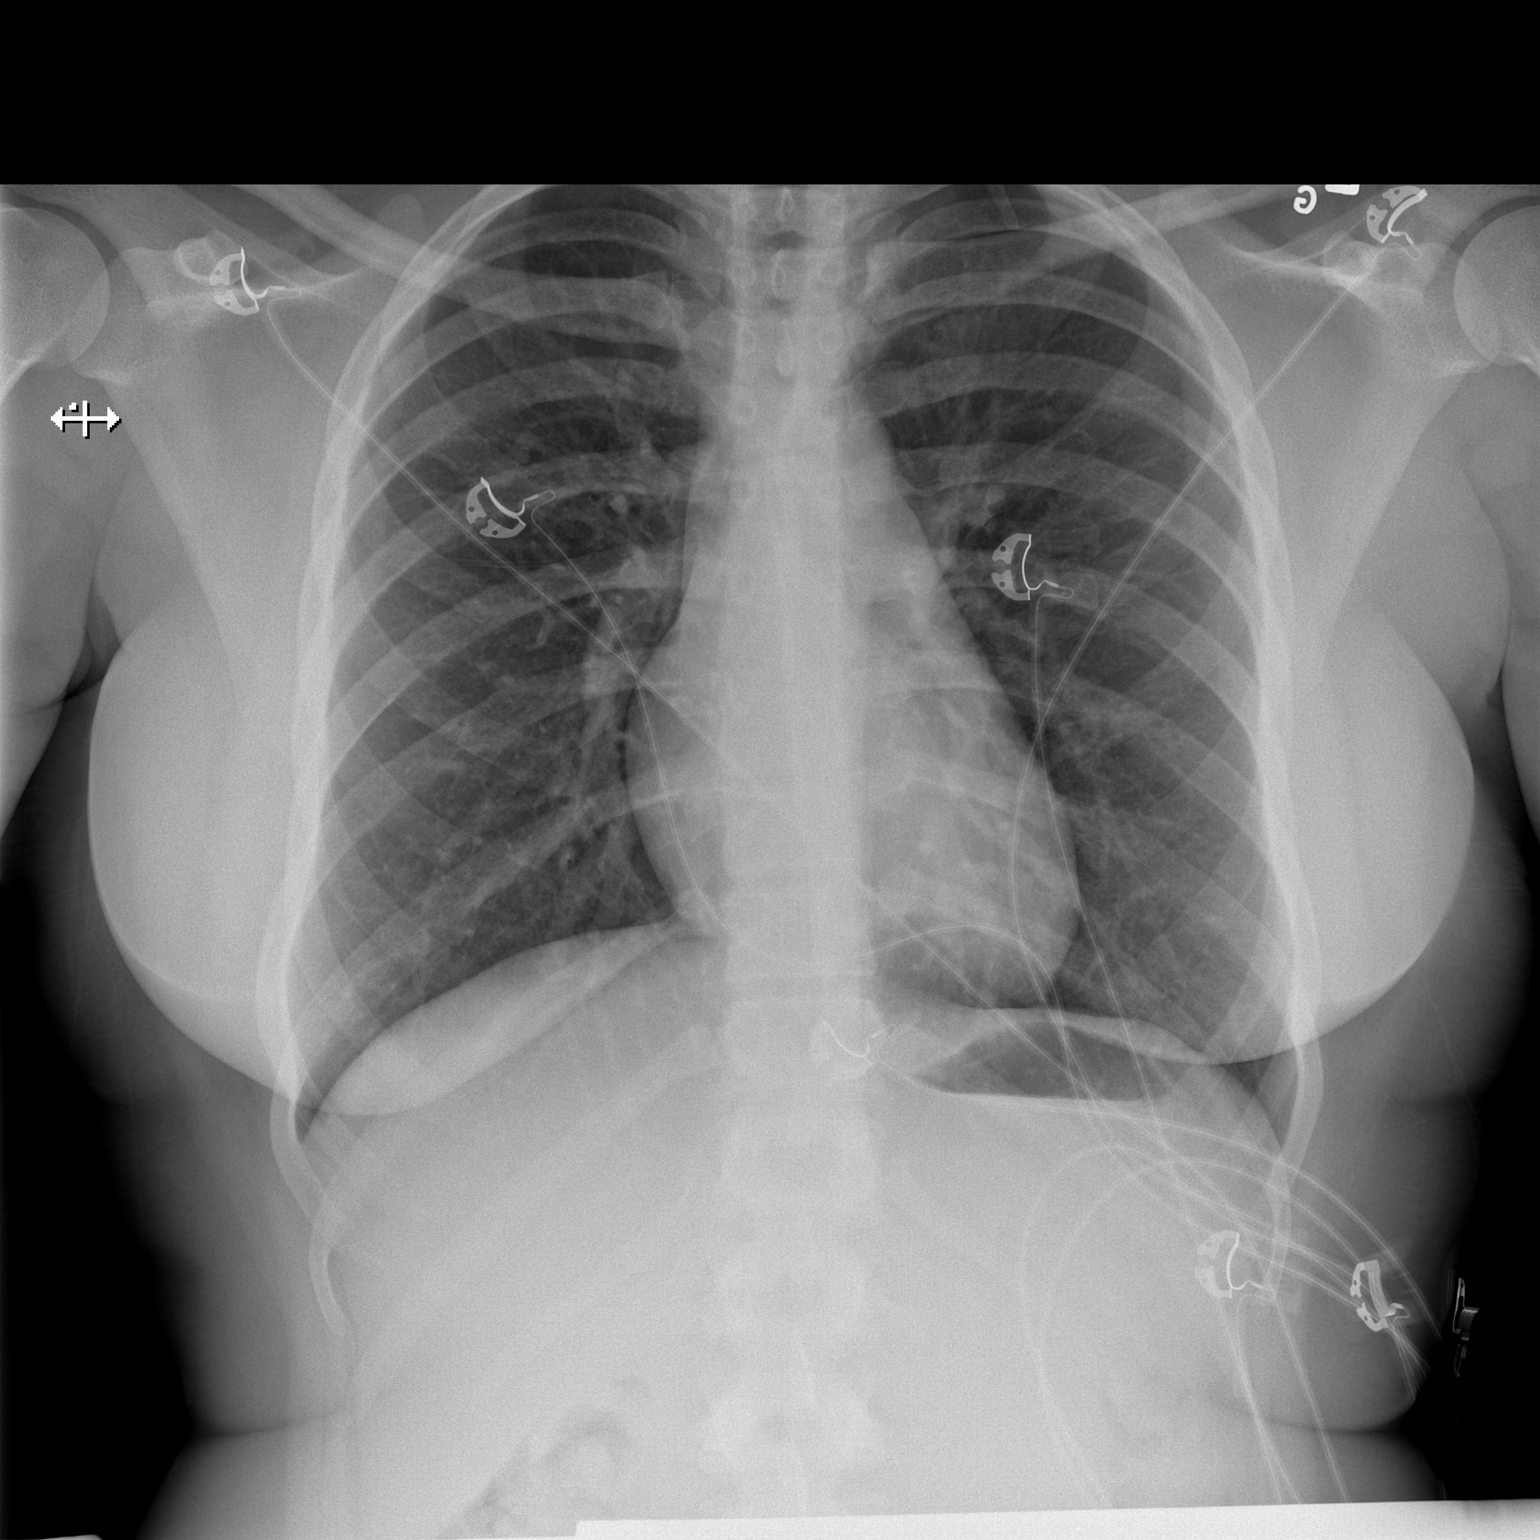

[w chest lat]
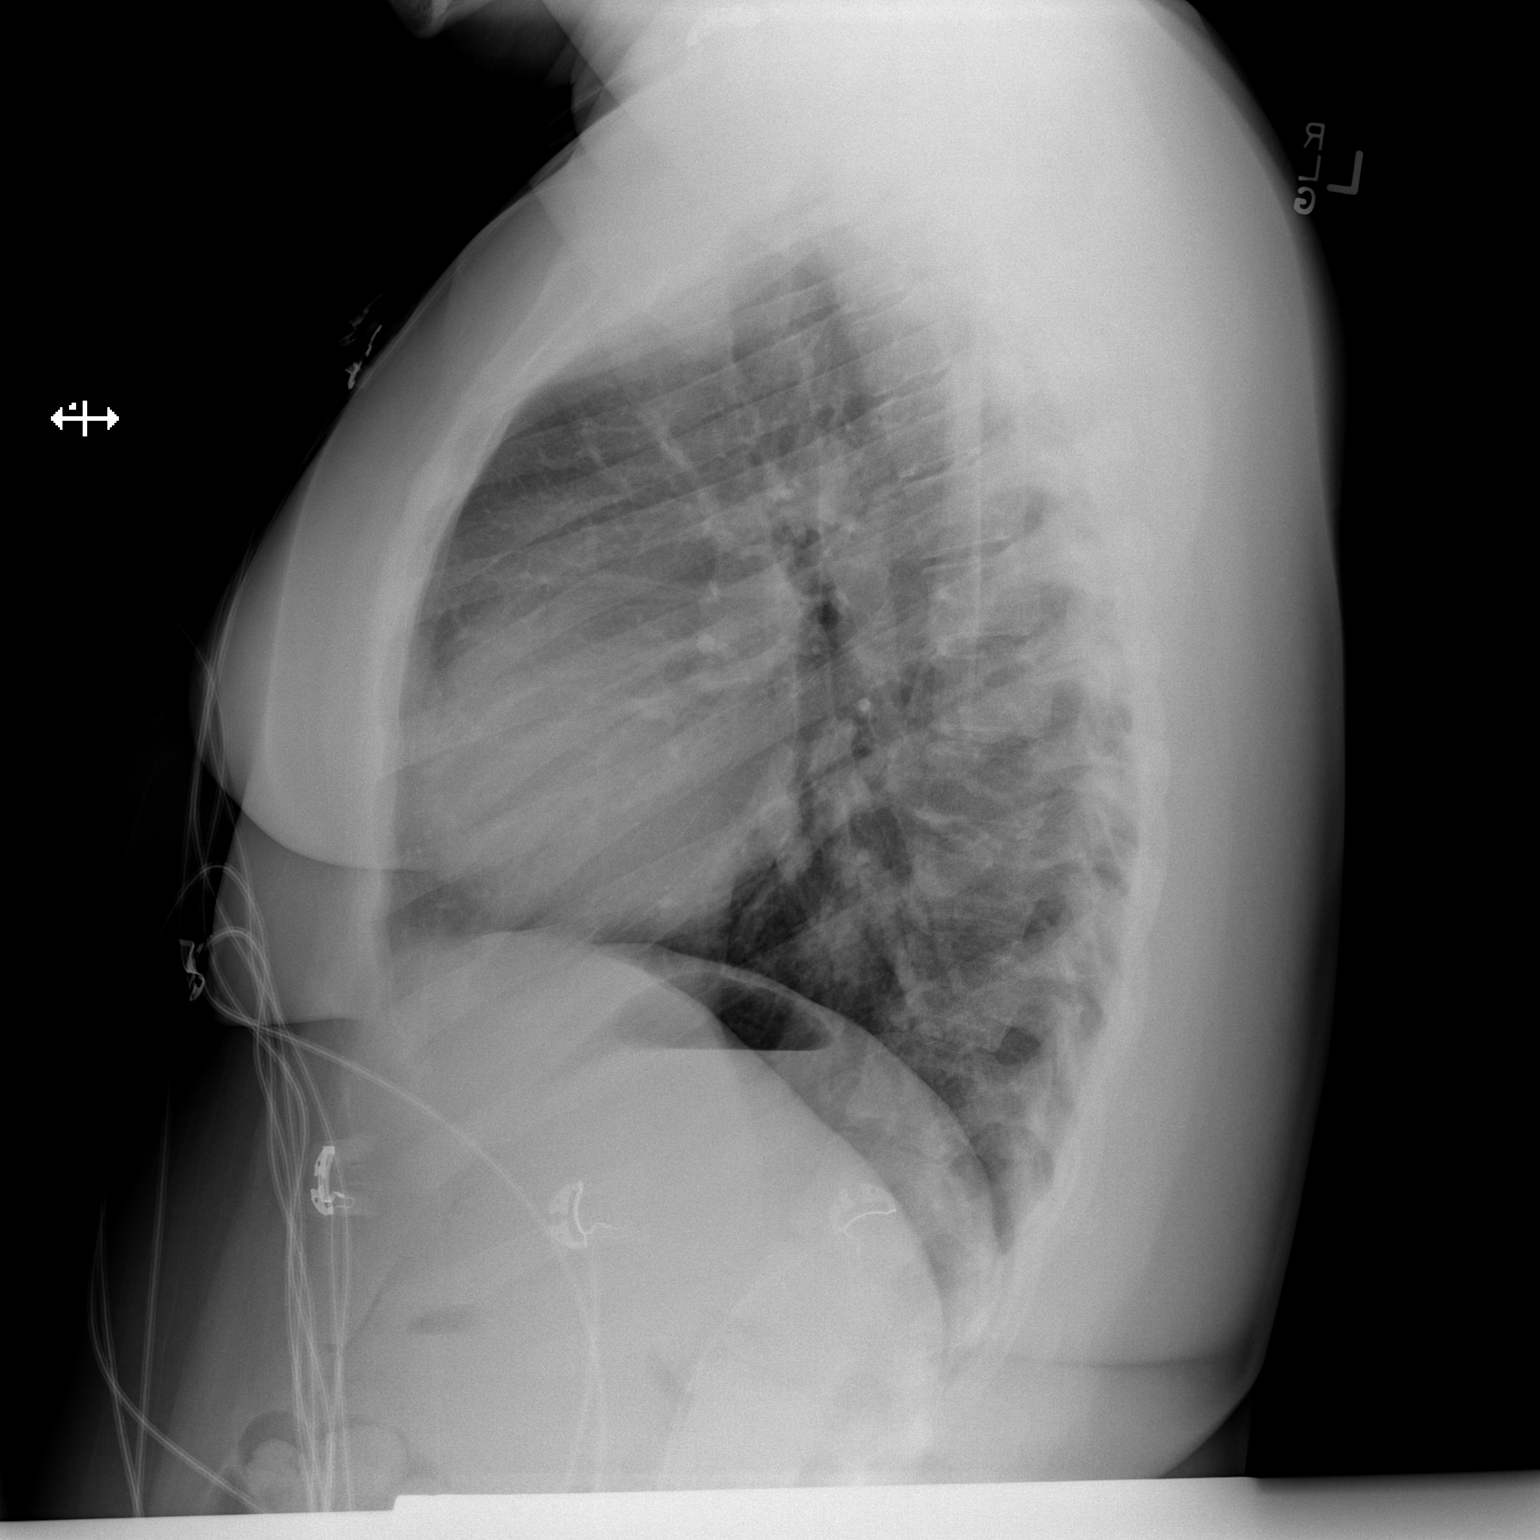

[2 of 2 positions shown; findings below may reference images not displayed]

FINDINGS: The lungs are well-aerated and clear. There is no evidence of focal
opacification, pleural effusion or pneumothorax.

The heart is normal in size; the mediastinal contour is within
normal limits. No acute osseous abnormalities are seen.
IMPRESSION: No acute cardiopulmonary process seen.

## 2015-10-12 ENCOUNTER — Other Ambulatory Visit: Payer: Self-pay | Admitting: Physician Assistant

## 2015-10-12 ENCOUNTER — Other Ambulatory Visit (HOSPITAL_COMMUNITY)
Admission: RE | Admit: 2015-10-12 | Discharge: 2015-10-12 | Disposition: A | Discharge status: Home / Self Care | Payer: 59 | Source: Ambulatory Visit | Attending: Family Medicine | Admitting: Family Medicine

## 2015-10-12 DIAGNOSIS — Z124 Encounter for screening for malignant neoplasm of cervix: Secondary | ICD-10-CM | POA: Diagnosis not present

## 2015-10-14 LAB — CYTOLOGY - PAP

## 2015-11-07 ENCOUNTER — Ambulatory Visit (HOSPITAL_COMMUNITY)
Admission: EM | Admit: 2015-11-07 | Discharge: 2015-11-07 | Disposition: A | Discharge status: Home / Self Care | Payer: 59 | Attending: Internal Medicine | Admitting: Internal Medicine

## 2015-11-07 ENCOUNTER — Encounter (HOSPITAL_COMMUNITY): Payer: Self-pay | Admitting: Family Medicine

## 2015-11-07 DIAGNOSIS — M791 Myalgia: Secondary | ICD-10-CM

## 2015-11-07 DIAGNOSIS — M7918 Myalgia, other site: Secondary | ICD-10-CM

## 2015-11-07 MED ORDER — NAPROXEN 500 MG PO TABS: 500.0000 mg | ORAL_TABLET | Freq: Two times a day (BID) | ORAL | 0 refills | Status: AC

## 2015-11-07 NOTE — ED Provider Notes (Signed)
[facility]    CSN: [id number] Arrival date & time: 11/07/15  1616     History   Chief Complaint Chief Complaint  Patient presents with  . Chest Pain  . Nausea    HPI Whitney Andrews is a 25 y.o. female. She presents today after having sharp upper left chest discomfort for about an hour, while she was sitting on the couch. This radiated through to her back, and was accompanied by some nausea. No dyspnea, no cough. No unusual leg pain or swelling. No vomiting. Has had a little bit of heartburn. Equivocal palpitations. No change in bowel habits. No new activities, no trauma. Discomfort is aggravated by shoulder motions, including raising the arms overhead at the shoulder, and external rotation. Discomfort is not changed by neck motion. Discomfort is not pleuritic. Not changed by eating. She has no headache, no runny/congested nose, no sore throat. Ex as a Haematologist, is on the computer a lot, standing and sitting. Recently, in the last couple weeks, changed her hours from 28 hours a week to 40.   HPI  Past Medical History:  Diagnosis Date  . Anxiety   . Hypertension   . Migraines     Patient Active Problem List   Diagnosis Date Noted  . Obesity 09/19/2014    History reviewed. No pertinent surgical history.   Home Medications    Prior to Admission medications   Medication Sig Start Date End Date Taking? Authorizing Provider  escitalopram (LEXAPRO) 10 MG tablet Take 10 mg by mouth daily.   Yes Historical Provider, MD  metoprolol tartrate (LOPRESSOR) 25 MG tablet Take 25 mg by mouth once.   Yes Historical Provider, MD  naproxen (NAPROSYN) 500 MG tablet Take 1 tablet (500 mg total) by mouth 2 (two) times daily. 11/07/15   Eustace Moore, MD  Norgestim-Eth Charlott Holler Triphasic (ORTHO TRI-CYCLEN, 28, PO) Take by mouth.    Historical Provider, MD  SUMAtriptan (IMITREX) 50 MG tablet Take 1 tablet (50 mg total) by mouth every 2 (two) hours as needed for migraine. May repeat in 2  hours if headache persists or recurs. 04/20/14   Rodolph Bong, MD  TRI-LO-ESTARYLLA 0.18/0.215/0.25 MG-25 MCG tab  09/17/14   Historical Provider, MD    Family History History reviewed. No pertinent family history.  Social History Social History  Substance Use Topics  . Smoking status: Never Smoker  . Smokeless tobacco: Never Used  . Alcohol use No     Allergies   Review of patient's allergies indicates no known allergies.   Review of Systems Review of Systems  All other systems reviewed and are negative.    Physical Exam Triage Vital Signs ED Triage Vitals  Enc Vitals Group     BP 11/07/15 1728 154/88     Pulse Rate 11/07/15 1728 73     Resp 11/07/15 1728 18     Temp 11/07/15 1728 98.5 F (36.9 C)     Temp src --      SpO2 11/07/15 1728 100 %     Weight --      Height --      Pain Score 11/07/15 1729 5   Updated Vital Signs BP 154/88   Pulse 73   Temp 98.5 F (36.9 C)   Resp 18   LMP 10/12/2015   SpO2 100%  Physical Exam  Constitutional: She is oriented to person, place, and time. No distress.  Alert, nicely groomed  HENT:  Head: Atraumatic.  Eyes:  Conjugate gaze, no eye redness/drainage  Neck: Neck supple.  Cardiovascular: Normal rate and regular rhythm.   Pulmonary/Chest: No respiratory distress.  Lungs clear, symmetric breath sounds  Abdominal: She exhibits no distension. There is no rebound and no guarding.  Very mild discomfort to deep palpation to the right of the umbilicus  Musculoskeletal: Normal range of motion.  No leg swelling  Neurological: She is alert and oriented to person, place, and time.  Skin: Skin is warm and dry.  No cyanosis  Nursing note and vitals reviewed.    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   Prescription for naproxen should help with discomfort, and was sent to the [facility] on [address].  Recheck or followup with primary care provider Dr Quintella ReichertHooper for further evaluation if symptoms not improving in several days.  New Prescriptions New Prescriptions   NAPROXEN (NAPROSYN) 500 MG TABLET    Take 1 tablet (500 mg total) by mouth 2 (two) times daily.     Eustace MooreLaura W Joydan Gretzinger, MD 11/12/15 [phone]

## 2015-11-07 NOTE — Discharge Instructions (Addendum)
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   Prescription for naproxen should help with discomfort, and was sent to the [facility] on [address].  Recheck or followup with primary care provider Dr Quintella ReichertHooper for further evaluation if symptoms not improving in several days.

## 2015-11-07 NOTE — ED Triage Notes (Signed)
Pt here for chest pain and nausea and sts sometimes worse after eating. sts she has chest pain all the time but this is worse. Pt also hx of anxiety.

## 2019-04-16 ENCOUNTER — Ambulatory Visit: Payer: Self-pay | Attending: Internal Medicine

## 2019-04-16 DIAGNOSIS — Z23 Encounter for immunization: Secondary | ICD-10-CM

## 2019-04-16 NOTE — Progress Notes (Signed)
   Covid-19 Vaccination Clinic  Name:  Whitney Andrews    MRN: [id number] DOB: 06-Oct-1990  04/16/2019  [name] was observed post Covid-19 immunization for 15 minutes without incident. She was provided with Vaccine Information Sheet and instruction to access the V-Safe system.   [name] was instructed to call 911 with any severe reactions post vaccine: Marland Kitchen Difficulty breathing  . Swelling of face and throat  . A fast heartbeat  . A bad rash all over body  . Dizziness and weakness   Immunizations Administered    Name Date Dose VIS Date Route   Pfizer COVID-19 Vaccine 04/16/2019  8:53 AM 0.3 mL 01/09/2019 Intramuscular   Manufacturer: [employer], [employer]   Lot: [id number]   NDC: [address]-8

## 2019-05-11 ENCOUNTER — Ambulatory Visit: Payer: Self-pay | Attending: Internal Medicine

## 2019-05-11 DIAGNOSIS — Z23 Encounter for immunization: Secondary | ICD-10-CM

## 2019-05-11 NOTE — Progress Notes (Signed)
   Covid-19 Vaccination Clinic  Name:  Shaquala Broeker    MRN: [id number] DOB: 09/19/1990  05/11/2019  Ms. Messamore was observed post Covid-19 immunization for 15 minutes without incident. She was provided with Vaccine Information Sheet and instruction to access the V-Safe system.   Ms. Terris was instructed to call 911 with any severe reactions post vaccine: Marland Kitchen Difficulty breathing  . Swelling of face and throat  . A fast heartbeat  . A bad rash all over body  . Dizziness and weakness   Immunizations Administered    Name Date Dose VIS Date Route   Pfizer COVID-19 Vaccine 05/11/2019  8:34 AM 0.3 mL 01/09/2019 Intramuscular   Manufacturer: [employer], [employer]   Lot: [id number]   NDC: [address]-5

## 2020-06-29 DIAGNOSIS — Z20822 Contact with and (suspected) exposure to covid-19: Secondary | ICD-10-CM | POA: Diagnosis not present

## 2020-08-22 DIAGNOSIS — Z13220 Encounter for screening for lipoid disorders: Secondary | ICD-10-CM | POA: Diagnosis not present

## 2020-08-22 DIAGNOSIS — F419 Anxiety disorder, unspecified: Secondary | ICD-10-CM | POA: Diagnosis not present

## 2020-08-22 DIAGNOSIS — Z0000 Encounter for general adult medical examination without abnormal findings: Secondary | ICD-10-CM | POA: Diagnosis not present

## 2020-08-22 DIAGNOSIS — I10 Essential (primary) hypertension: Secondary | ICD-10-CM | POA: Diagnosis not present

## 2020-08-22 DIAGNOSIS — Z124 Encounter for screening for malignant neoplasm of cervix: Secondary | ICD-10-CM | POA: Diagnosis not present

## 2020-11-03 DIAGNOSIS — I10 Essential (primary) hypertension: Secondary | ICD-10-CM | POA: Diagnosis not present

## 2020-11-03 DIAGNOSIS — G479 Sleep disorder, unspecified: Secondary | ICD-10-CM | POA: Diagnosis not present

## 2020-11-03 DIAGNOSIS — E669 Obesity, unspecified: Secondary | ICD-10-CM | POA: Diagnosis not present

## 2020-11-07 DIAGNOSIS — G4719 Other hypersomnia: Secondary | ICD-10-CM | POA: Diagnosis not present

## 2020-11-07 DIAGNOSIS — I10 Essential (primary) hypertension: Secondary | ICD-10-CM | POA: Diagnosis not present

## 2020-11-07 DIAGNOSIS — E669 Obesity, unspecified: Secondary | ICD-10-CM | POA: Diagnosis not present

## 2020-11-07 DIAGNOSIS — F419 Anxiety disorder, unspecified: Secondary | ICD-10-CM | POA: Diagnosis not present

## 2020-11-09 ENCOUNTER — Other Ambulatory Visit: Payer: Self-pay

## 2020-11-09 ENCOUNTER — Encounter (HOSPITAL_COMMUNITY): Payer: Self-pay

## 2020-11-09 ENCOUNTER — Emergency Department (HOSPITAL_COMMUNITY): Payer: BC Managed Care – PPO

## 2020-11-09 ENCOUNTER — Emergency Department (HOSPITAL_COMMUNITY)
Admission: EM | Admit: 2020-11-09 | Discharge: 2020-11-09 | Disposition: A | Discharge status: Home / Self Care | Payer: BC Managed Care – PPO | Attending: Emergency Medicine | Admitting: Emergency Medicine

## 2020-11-09 DIAGNOSIS — Z79899 Other long term (current) drug therapy: Secondary | ICD-10-CM | POA: Insufficient documentation

## 2020-11-09 DIAGNOSIS — M25511 Pain in right shoulder: Secondary | ICD-10-CM | POA: Diagnosis not present

## 2020-11-09 DIAGNOSIS — R030 Elevated blood-pressure reading, without diagnosis of hypertension: Secondary | ICD-10-CM | POA: Diagnosis not present

## 2020-11-09 DIAGNOSIS — I10 Essential (primary) hypertension: Secondary | ICD-10-CM | POA: Insufficient documentation

## 2020-11-09 DIAGNOSIS — R079 Chest pain, unspecified: Secondary | ICD-10-CM | POA: Diagnosis not present

## 2020-11-09 LAB — CBC
HCT: 40.9 % (ref 36.0–46.0)
Hemoglobin: 13.5 g/dL (ref 12.0–15.0)
MCH: 27.2 pg (ref 26.0–34.0)
MCHC: 33 g/dL (ref 30.0–36.0)
MCV: 82.3 fL (ref 80.0–100.0)
Platelets: 358 10*3/uL (ref 150–400)
RBC: 4.97 MIL/uL (ref 3.87–5.11)
RDW: 13.1 % (ref 11.5–15.5)
WBC: 5.3 10*3/uL (ref 4.0–10.5)
nRBC: 0 % (ref 0.0–0.2)

## 2020-11-09 LAB — TROPONIN I (HIGH SENSITIVITY)
Troponin I (High Sensitivity): 3 ng/L (ref <18)
Troponin I (High Sensitivity): 4 ng/L (ref <18)

## 2020-11-09 LAB — BASIC METABOLIC PANEL
Anion gap: 7 (ref 5–15)
BUN: 8 mg/dL (ref 6–20)
CO2: 24 mmol/L (ref 22–32)
Calcium: 9.3 mg/dL (ref 8.9–10.3)
Chloride: 108 mmol/L (ref 98–111)
Creatinine, Ser: 0.82 mg/dL (ref 0.44–1.00)
GFR, Estimated: >60 mL/min (ref >60)
Glucose, Bld: 99 mg/dL (ref 70–99)
Potassium: 3.9 mmol/L (ref 3.5–5.1)
Sodium: 139 mmol/L (ref 135–145)

## 2020-11-09 LAB — I-STAT BETA HCG BLOOD, ED (MC, WL, AP ONLY): I-stat hCG, quantitative: <5 m[IU]/mL (ref <5)

## 2020-11-09 MED ORDER — AMLODIPINE BESYLATE 5 MG PO TABS: 5.0000 mg | ORAL_TABLET | Freq: Every day | ORAL | 0 refills | Status: AC

## 2020-11-09 MED ORDER — AMLODIPINE BESYLATE 5 MG PO TABS
5.0000 mg | ORAL_TABLET | Freq: Once | ORAL | Status: AC
  Administered 2020-11-09: 5 mg via ORAL
  Filled 2020-11-09: qty 1

## 2020-11-09 NOTE — ED Triage Notes (Signed)
Patient reports she took her BP at home today and it was 210/120. Patient also c/o headache and intermittent chest pain x 2 days.

## 2020-11-09 NOTE — Discharge Instructions (Addendum)
Start taking the new blood pressure medicine, tomorrow.

## 2020-11-09 NOTE — ED Provider Notes (Signed)
[facility]-EMERGENCY DEPT Provider Note   CSN: [id number] Arrival date & time: 11/09/20  1037     History Chief Complaint  Patient presents with   Hypertension   Chest Pain    Whitney Andrews is a 30 y.o. female.  HPI She presents for evaluation of high blood pressure and chest pain.  She checks her blood pressure regularly and 2 days ago, she started checking it because of having some chest pain that waxed and waned and went away at times.  She has had pressures as high as 180/120, at home.  She continues to take metoprolol daily for blood pressure and added an extra half tablet to see if it would help.  She has been on this for 6 years.  He denies associated diaphoresis, nausea, vomiting, weakness or dizziness.  No prior cardiac history.  She is not having episodes of dizziness.  She states that today the chest pain was near her right shoulder, and later on after arrival to the ER it became more in the center part of her chest.  There are no other known active modifying factors.    Past Medical History:  Diagnosis Date   Anxiety    Hypertension    Migraines     Patient Active Problem List   Diagnosis Date Noted   Obesity 09/19/2014    History reviewed. No pertinent surgical history.   OB History   No obstetric history on file.     Family History  Problem Relation Age of Onset   Chronic Renal Failure Mother    Hypertension Father    Sleep apnea Father     Social History   Tobacco Use   Smoking status: Never   Smokeless tobacco: Never  Vaping Use   Vaping Use: Never used  Substance Use Topics   Alcohol use: No   Drug use: No    Home Medications Prior to Admission medications   Medication Sig Start Date End Date Taking? Authorizing Provider  amLODipine (NORVASC) 5 MG tablet Take 1 tablet (5 mg total) by mouth daily. 11/09/20  Yes Mancel Bale, MD  escitalopram (LEXAPRO) 20 MG tablet Take 20 mg by mouth daily. 10/23/20  Yes [provider]  ibuprofen (ADVIL) 200 MG tablet Take 400 mg by mouth every 6 (six) hours as needed for headache.   Yes [provider]  metoprolol succinate (TOPROL-XL) 100 MG 24 hr tablet Take 100 mg by mouth daily. 10/21/20  Yes [provider]  naproxen (NAPROSYN) 500 MG tablet Take 1 tablet (500 mg total) by mouth 2 (two) times daily. Patient not taking: Reported on 11/09/2020 11/07/15   Isa Rankin, MD  SUMAtriptan (IMITREX) 50 MG tablet Take 1 tablet (50 mg total) by mouth every 2 (two) hours as needed for migraine. May repeat in 2 hours if headache persists or recurs. Patient not taking: Reported on 11/09/2020 04/20/14   Rodolph Bong, MD    Allergies    Patient has no known allergies.  Review of Systems   Review of Systems  All other systems reviewed and are negative.  Physical Exam Updated Vital Signs BP (!) 182/155   Pulse (!) 42   Temp 98.1 F (36.7 C) (Oral)   Resp 17   Ht 5\' 7"  (1.702 m)   Wt 98.9 kg   LMP 09/29/2020 (Approximate)   SpO2 100%   BMI 34.14 kg/m   Physical Exam Vitals and nursing note reviewed.  Constitutional:  General: She is not in acute distress.    Appearance: She is well-developed. She is obese. She is not ill-appearing, toxic-appearing or diaphoretic.  HENT:     Head: Normocephalic and atraumatic.     Right Ear: External ear normal.     Left Ear: External ear normal.  Eyes:     Conjunctiva/sclera: Conjunctivae normal.     Pupils: Pupils are equal, round, and reactive to light.  Neck:     Trachea: Phonation normal.  Cardiovascular:     Rate and Rhythm: Normal rate and regular rhythm.     Heart sounds: Normal heart sounds.  Pulmonary:     Effort: Pulmonary effort is normal.     Breath sounds: Normal breath sounds.  Abdominal:     General: There is no distension.     Palpations: Abdomen is soft.     Tenderness: There is no abdominal tenderness.  Musculoskeletal:        General: Normal range of motion.      Cervical back: Normal range of motion and neck supple.  Skin:    General: Skin is warm and dry.     Coloration: Skin is not jaundiced or pale.  Neurological:     Mental Status: She is alert and oriented to person, place, and time.     Cranial Nerves: No cranial nerve deficit.     Sensory: No sensory deficit.     Motor: No abnormal muscle tone.     Coordination: Coordination normal.  Psychiatric:        Mood and Affect: Mood normal.        Behavior: Behavior normal.        Thought Content: Thought content normal.        Judgment: Judgment normal.    ED Results / Procedures / Treatments   Labs (all labs ordered are listed, but only abnormal results are displayed) Labs Reviewed  BASIC METABOLIC PANEL  CBC  I-STAT BETA HCG BLOOD, ED ([facility], [facility], [facility] ONLY)  TROPONIN I (HIGH SENSITIVITY)  TROPONIN I (HIGH SENSITIVITY)    EKG EKG Interpretation  Date/Time:  Wednesday November 09 2020 11:27:21 EDT Ventricular Rate:  43 PR Interval:  184 QRS Duration: 116 QT Interval:  439 QTC Calculation: 372 R Axis:   57 Text Interpretation: Sinus bradycardia Nonspecific intraventricular conduction delay Since last tracing Non-specific ST-t changes anteriorly Otherwise no significant change Confirmed by Mancel Bale [phone]) on 11/09/2020 12:25:29 PM  Radiology DG Chest 2 View  Result Date: 11/09/2020 CLINICAL DATA:  Chest pain EXAM: CHEST - 2 VIEW COMPARISON:  12/11/2012 FINDINGS: The heart size and mediastinal contours are within normal limits. Both lungs are clear. The visualized skeletal structures are unremarkable. IMPRESSION: Normal chest x-rays. Electronically Signed   By: Duanne Guess D.O.   On: 11/09/2020 12:33    Procedures Procedures   Medications Ordered in ED Medications  amLODipine (NORVASC) tablet 5 mg (5 mg Oral Given 11/09/20 1249)    ED Course  I have reviewed the triage vital signs and the nursing notes.  Pertinent labs & imaging results that were available during  my care of the patient were reviewed by me and considered in my medical decision making (see chart for details).  Clinical Course as of 11/09/20 1542  Wed Nov 09, 2020  1241 Discussed abnormal EKG result Dr. Excell Seltzer.  He thinks this may indicate a nonspecific interventricular conduction delay [EW]  1530 At this time the blood pressure is 172/104. [EW]  Clinical Course User Index [EW] Mancel Bale, MD   MDM Rules/Calculators/A&P                            Patient Vitals for the past 24 hrs:  BP Temp Temp src Pulse Resp SpO2 Height Weight  11/09/20 1500 (!) 182/155 -- -- -- 17 -- -- --  11/09/20 1400 (!) 203/107 -- -- (!) 42 12 100 % -- --  11/09/20 1300 (!) 210/125 -- -- (!) 42 14 100 % -- --  11/09/20 1249 (!) 207/136 -- -- -- -- -- -- --  11/09/20 1200 (!) 187/112 -- -- (!) 49 17 100 % -- --  11/09/20 1121 -- -- -- -- -- -- 5\' 7"  (1.702 m) 98.9 kg  11/09/20 1114 (!) 180/90 98.1 F (36.7 C) Oral (!) 48 18 100 % -- --  11/09/20 1112 -- 98.1 F (36.7 C) Oral (!) 45 18 100 % -- --    3:39 PM Reevaluation with update and discussion. After initial assessment and treatment, an updated evaluation reveals blood pressure 170/104.  She is comfortable has no further complaints.  Findings discussed with the patient all questions were answered. 01/09/21   Medical Decision Making:  This patient is presenting for evaluation of high blood pressure and chest pain, which does require a range of treatment options, and is a complaint that involves a moderate risk of morbidity and mortality. The differential diagnoses include hypertensive urgency, ACS, nonspecific chest pain. I decided to review old records, and in summary middle-aged female, with history of high blood pressure, having ongoing chest pain with high blood pressure at home.  I did not require additional historical information from anyone.  Clinical Laboratory Tests Ordered, included CBC, Metabolic panel, and troponin , pregnancy  test. Review indicates normal findings. Radiologic Tests Ordered, included chest x-ray.  I independently Visualized: Radiograph images, which show no infiltrate or edema  Cardiac Monitor Tracing which shows sinus bradycardia   Critical Interventions-clinical evaluation, laboratory testing, radiography, observation and reassessment  After These Interventions, the Patient was reevaluated and was found with reassuring initial evaluation.  Abnormal EKG, not completely diagnostic for STEMI.  Bradycardia likely related to use of metoprolol.  Patient's blood pressure improved with treatment of Norvasc.  Doubt hypertensive urgency, ACS or metabolic instability.  Suspect relative bradycardia due to taking extra metoprolol this morning.  At this point she is advised to continue metoprolol, but follow-up with her PCP next week for possible additional medication management.  She does not appear to require metoprolol for rate control.  She may benefit from use of a diuretic.  She is to stay on a low-sodium diet.  CRITICAL CARE-no Performed by: Mancel Bale  Nursing Notes Reviewed/ Care Coordinated Applicable Imaging Reviewed Interpretation of Laboratory Data incorporated into ED treatment  The patient appears reasonably screened and/or stabilized for discharge and I doubt any other medical condition or other [facility] requiring further screening, evaluation, or treatment in the ED at this time prior to discharge.  Plan: Home Medications-continue usual, as prescribed; Home Treatments-low-sodium diet; return here if the recommended treatment, does not improve the symptoms; Recommended follow up-PCP next week for blood pressure check, reevaluation and medication management as needed     Final Clinical Impression(s) / ED Diagnoses Final diagnoses:  Hypertension, unspecified type    Rx / DC Orders ED Discharge Orders          Ordered    amLODipine (  NORVASC) 5 MG tablet  Daily        11/09/20 1540              Mancel Bale, MD 11/09/20 1542

## 2020-11-11 DIAGNOSIS — I10 Essential (primary) hypertension: Secondary | ICD-10-CM | POA: Diagnosis not present

## 2020-11-11 DIAGNOSIS — G4733 Obstructive sleep apnea (adult) (pediatric): Secondary | ICD-10-CM | POA: Diagnosis not present

## 2020-11-11 DIAGNOSIS — E669 Obesity, unspecified: Secondary | ICD-10-CM | POA: Diagnosis not present

## 2020-11-17 DIAGNOSIS — I10 Essential (primary) hypertension: Secondary | ICD-10-CM | POA: Diagnosis not present

## 2020-11-17 DIAGNOSIS — Z23 Encounter for immunization: Secondary | ICD-10-CM | POA: Diagnosis not present

## 2021-01-16 DIAGNOSIS — J029 Acute pharyngitis, unspecified: Secondary | ICD-10-CM | POA: Diagnosis not present

## 2021-12-05 DIAGNOSIS — Z124 Encounter for screening for malignant neoplasm of cervix: Secondary | ICD-10-CM | POA: Diagnosis not present

## 2021-12-05 DIAGNOSIS — Z0000 Encounter for general adult medical examination without abnormal findings: Secondary | ICD-10-CM | POA: Diagnosis not present

## 2021-12-05 DIAGNOSIS — Z13220 Encounter for screening for lipoid disorders: Secondary | ICD-10-CM | POA: Diagnosis not present

## 2021-12-05 DIAGNOSIS — I10 Essential (primary) hypertension: Secondary | ICD-10-CM | POA: Diagnosis not present

## 2021-12-05 DIAGNOSIS — R87610 Atypical squamous cells of undetermined significance on cytologic smear of cervix (ASC-US): Secondary | ICD-10-CM | POA: Diagnosis not present

## 2022-03-14 ENCOUNTER — Emergency Department (HOSPITAL_COMMUNITY)
Admission: EM | Admit: 2022-03-14 | Discharge: 2022-03-14 | Disposition: A | Discharge status: Home / Self Care | Payer: 59 | Attending: Emergency Medicine | Admitting: Emergency Medicine

## 2022-03-14 ENCOUNTER — Other Ambulatory Visit: Payer: Self-pay

## 2022-03-14 ENCOUNTER — Encounter (HOSPITAL_COMMUNITY): Payer: Self-pay

## 2022-03-14 DIAGNOSIS — R0981 Nasal congestion: Secondary | ICD-10-CM | POA: Diagnosis not present

## 2022-03-14 DIAGNOSIS — R599 Enlarged lymph nodes, unspecified: Secondary | ICD-10-CM | POA: Diagnosis not present

## 2022-03-14 DIAGNOSIS — Z6841 Body Mass Index (BMI) 40.0 and over, adult: Secondary | ICD-10-CM | POA: Diagnosis not present

## 2022-03-14 DIAGNOSIS — J329 Chronic sinusitis, unspecified: Secondary | ICD-10-CM | POA: Insufficient documentation

## 2022-03-14 DIAGNOSIS — R591 Generalized enlarged lymph nodes: Secondary | ICD-10-CM

## 2022-03-14 DIAGNOSIS — I10 Essential (primary) hypertension: Secondary | ICD-10-CM | POA: Diagnosis not present

## 2022-03-14 DIAGNOSIS — R590 Localized enlarged lymph nodes: Secondary | ICD-10-CM | POA: Diagnosis not present

## 2022-03-14 MED ORDER — AMOXICILLIN-POT CLAVULANATE 875-125 MG PO TABS: 1.0000 | ORAL_TABLET | Freq: Two times a day (BID) | ORAL | 0 refills | Status: AC

## 2022-03-14 NOTE — ED Triage Notes (Signed)
Pt c/o right sided lymphadenopathy since Monday afternoon that is progressively getting worse. States she has a mild congestion and headache.   Pt denies fever, ear pain, or sore throat.

## 2022-03-14 NOTE — ED Provider Notes (Signed)
[facility] EMERGENCY DEPARTMENT AT [facility] Provider Note   CSN: [id number] Arrival date & time: 03/14/22  1858     History  Chief Complaint  Patient presents with   Lymphadenopathy    Whitney Andrews is a 32 y.o. female.  32 year old female presents today for evaluation of right sided anterior cervical lymphadenopathy that is been ongoing for 2 days.  She also reports sinus congestion, sinus headache for the past several days.  Denies fever, night sweats, unintentional weight loss, or other complaints.  [facility] and was referred to the emergency department because there was no apparent evidence of ear infection.  The history is provided by the patient. No language interpreter was used.       Home Medications Prior to Admission medications   Medication Sig Start Date End Date Taking? Authorizing Provider  amoxicillin-clavulanate (AUGMENTIN) 875-125 MG tablet Take 1 tablet by mouth every 12 (twelve) hours. 03/14/22  Yes Coretta Leisey, PA-C  amLODipine (NORVASC) 5 MG tablet Take 1 tablet (5 mg total) by mouth daily. 11/09/20   Daleen Bo, MD  escitalopram (LEXAPRO) 20 MG tablet Take 20 mg by mouth daily. 10/23/20   [provider]  ibuprofen (ADVIL) 200 MG tablet Take 400 mg by mouth every 6 (six) hours as needed for headache.    [provider]  metoprolol succinate (TOPROL-XL) 100 MG 24 hr tablet Take 100 mg by mouth daily. 10/21/20   [provider]  naproxen (NAPROSYN) 500 MG tablet Take 1 tablet (500 mg total) by mouth 2 (two) times daily. Patient not taking: Reported on 11/09/2020 11/07/15   Wynona Luna, MD  SUMAtriptan (IMITREX) 50 MG tablet Take 1 tablet (50 mg total) by mouth every 2 (two) hours as needed for migraine. May repeat in 2 hours if headache persists or recurs. Patient not taking: Reported on 11/09/2020 04/20/14   Gregor Hams, MD      Allergies    Patient has no known allergies.    Review of  Systems   Review of Systems  Constitutional:  Negative for chills and fever.  HENT:  Positive for congestion and sinus pressure. Negative for sore throat and voice change.   Respiratory:  Positive for cough. Negative for shortness of breath.   Cardiovascular:  Negative for chest pain.  Gastrointestinal:  Negative for abdominal pain.  Neurological:  Negative for light-headedness.  All other systems reviewed and are negative.   Physical Exam Updated Vital Signs BP 126/76 (BP Location: Right Arm)   Pulse 69   Temp 99.1 F (37.3 C) (Oral)   Resp 17   Ht 5' 7"$  (1.702 m)   Wt 113.4 kg   LMP 02/21/2022 (Exact Date)   SpO2 100%   BMI 39.16 kg/m  Physical Exam Vitals and nursing note reviewed.  Constitutional:      General: She is not in acute distress.    Appearance: Normal appearance. She is not ill-appearing.  HENT:     Head: Normocephalic and atraumatic.     Nose: Nose normal.  Eyes:     General: No scleral icterus.    Extraocular Movements: Extraocular movements intact.     Conjunctiva/sclera: Conjunctivae normal.  Neck:     Comments: Right-sided tender anterior cervical lymphadenopathy Cardiovascular:     Rate and Rhythm: Normal rate and regular rhythm.     Pulses: Normal pulses.  Pulmonary:     Effort: Pulmonary effort is normal. No respiratory distress.  Breath sounds: Normal breath sounds. No wheezing or rales.  Abdominal:     General: There is no distension.     Palpations: Abdomen is soft.     Tenderness: There is no abdominal tenderness. There is no guarding.  Musculoskeletal:        General: Normal range of motion.     Cervical back: Normal range of motion.  Skin:    General: Skin is warm and dry.  Neurological:     General: No focal deficit present.     Mental Status: She is alert. Mental status is at baseline.     ED Results / Procedures / Treatments   Labs (all labs ordered are listed, but only abnormal results are displayed) Labs Reviewed - No  data to display  EKG None  Radiology No results found.  Procedures Procedures    Medications Ordered in ED Medications - No data to display  ED Course/ Medical Decision Making/ A&P                             Medical Decision Making Risk Prescription drug management.   32 year old female presents today for evaluation of anterior cervical lymphadenopathy on the right side.  Present for the past couple days.  Symptoms consistent with sinusitis present for about 4 days.  Likely lymphadenopathy is reactive to the sinusitis.  Will start on Augmentin.  Sinus rinse discussed.  Strict return precautions discussed.  Discussed importance of following up with her primary care provider early to mid next week for reevaluation.  Patient is in agreement with this plan.  Patient is appropriate for discharge.  Final Clinical Impression(s) / ED Diagnoses Final diagnoses:  Lymphadenopathy  Sinusitis, unspecified chronicity, unspecified location    Rx / DC Orders ED Discharge Orders          Ordered    amoxicillin-clavulanate (AUGMENTIN) 875-125 MG tablet  Every 12 hours        03/14/22 2319              Evlyn Courier, PA-C 03/14/22 2325    Ezequiel Essex, MD 03/15/22 [phone]

## 2022-03-14 NOTE — Discharge Instructions (Addendum)
Urine dip today was overall reassuring.  Your lymph nodes are likely reactive to a local sinus infection.  I have sent antibiotics into your pharmacy.  If you have worsening symptoms please return to the emergency department otherwise please schedule an appointment to follow-up with your primary care provider sometime next week.

## 2022-03-14 NOTE — ED Provider Triage Note (Signed)
Emergency Medicine Provider Triage Evaluation Note  Elisama Crisologo , a 32 y.o. female  was evaluated in triage.  Pt complains of right neck lymphadenopathy, sore throat, and congestion for the last 2 days. She denies fever, chills, chest pain, SOB, abdominal pain, difficulty swallowing, nausea, or vomiting. She states she went to [facility] today and they told her she did not have an ear infection but did not do swabs or other testing and sent her to ED for further evaluation.   Review of Systems  Positive: See HPI Negative: See HPI  Physical Exam  BP 126/76 (BP Location: Right Arm)   Pulse 69   Temp 99.1 F (37.3 C) (Oral)   Resp 17   Ht 5' 7"$  (1.702 m)   Wt 113.4 kg   LMP 02/21/2022 (Exact Date)   SpO2 100%   BMI 39.16 kg/m  Gen:   Awake, no distress   Resp:  Normal effort LCTA MSK:   Moves extremities without difficulty  Other:  A few small anterior cervical lymph nodes, one small posterior auricular lymph nodes, all mobile but tender, no overlying skin changes, mild erythema to posterior pharynx but uvula midline and airway patent, abdomen soft and nontender  Medical Decision Making  Medically screening exam initiated at 8:22 PM.  Appropriate orders placed.  Caralena Hasch was informed that the remainder of the evaluation will be completed by another provider, this initial triage assessment does not replace that evaluation, and the importance of remaining in the ED until their evaluation is complete.     Turner Daniels 03/14/22 2024

## 2022-03-22 DIAGNOSIS — R599 Enlarged lymph nodes, unspecified: Secondary | ICD-10-CM | POA: Diagnosis not present

## 2022-03-22 DIAGNOSIS — J019 Acute sinusitis, unspecified: Secondary | ICD-10-CM | POA: Diagnosis not present

## 2023-03-27 IMAGING — CR DG CHEST 2V
2 series · 2 of 2 positions shown · non-contrast
Comparison: 12/11/2012

CLINICAL DATA: Chest pain

EXAM:
CHEST - 2 VIEW

[w chest pa]
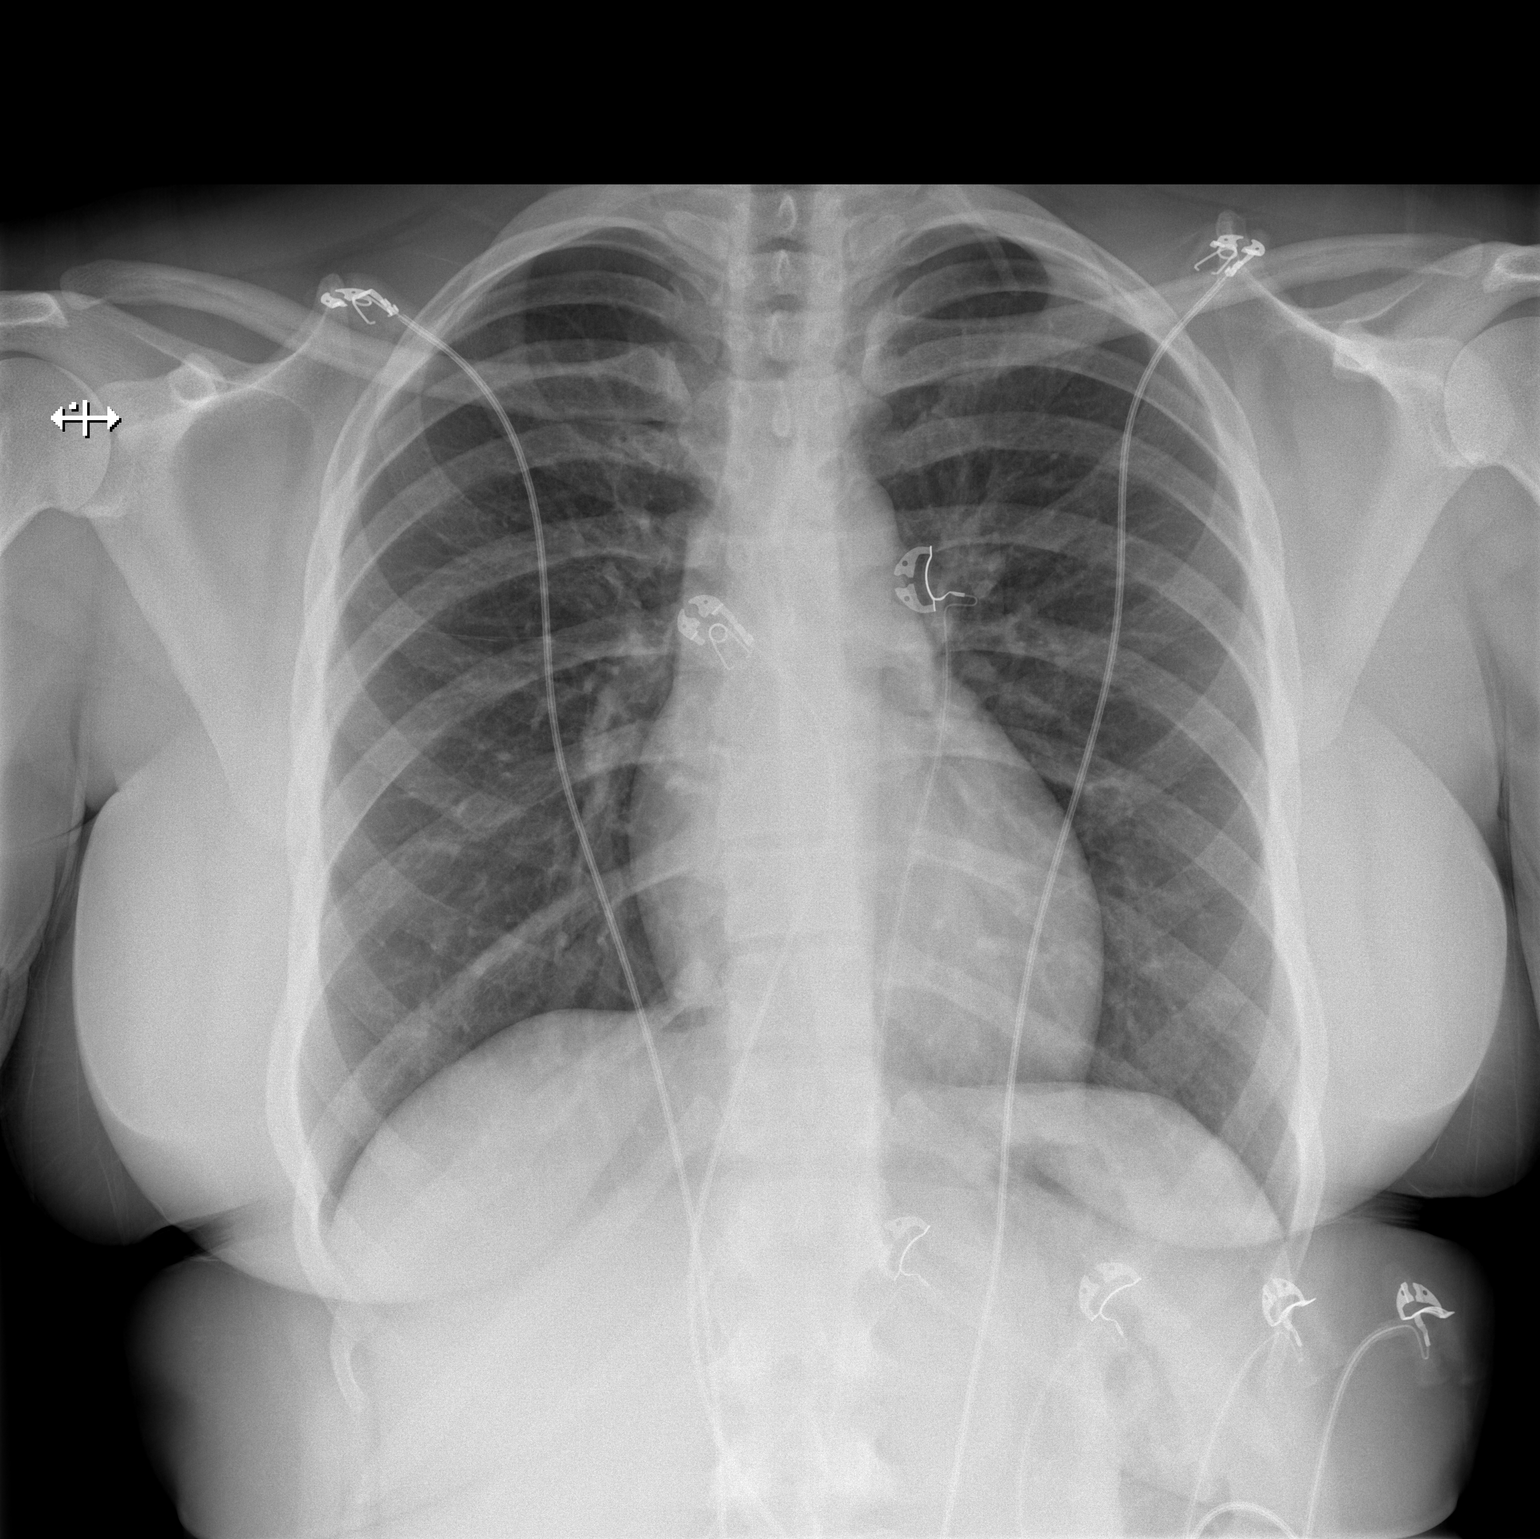

[w chest lat]
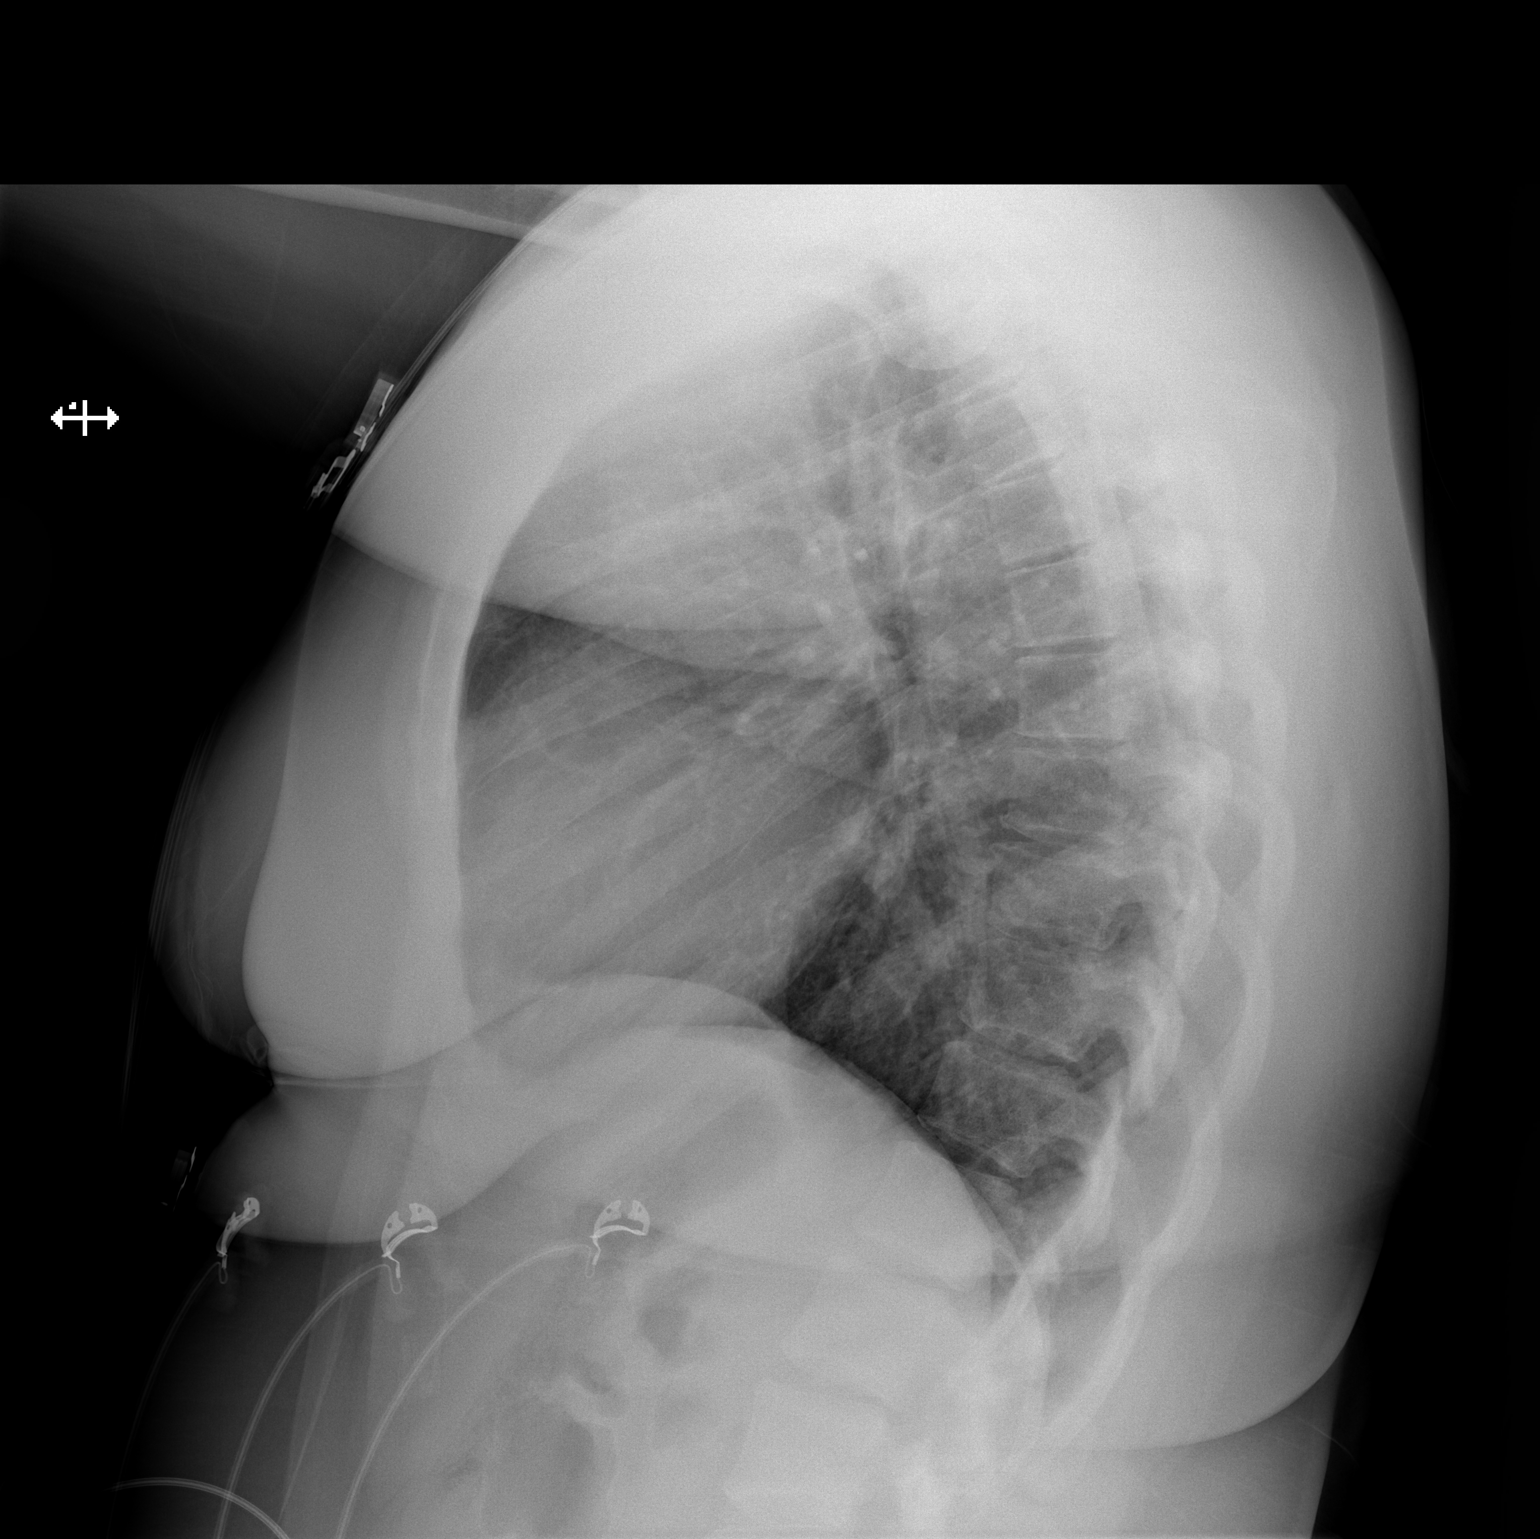

[2 of 2 positions shown; findings below may reference images not displayed]

FINDINGS: The heart size and mediastinal contours are within normal limits.
Both lungs are clear. The visualized skeletal structures are
unremarkable.
IMPRESSION: Normal chest x-rays.

## 2023-07-11 DIAGNOSIS — Z6839 Body mass index (BMI) 39.0-39.9, adult: Secondary | ICD-10-CM | POA: Diagnosis not present

## 2023-07-11 DIAGNOSIS — Z13220 Encounter for screening for lipoid disorders: Secondary | ICD-10-CM | POA: Diagnosis not present

## 2023-07-11 DIAGNOSIS — E66812 Obesity, class 2: Secondary | ICD-10-CM | POA: Diagnosis not present
